# Patient Record
Sex: Female | Born: 1960 | Race: Black or African American | Hispanic: No | Marital: Married | Smoking: Former smoker
Health system: Southern US, Community
[De-identification: ages and names within clinical notes are randomized; demographics above are authoritative.]

## PROBLEM LIST (undated history)

## (undated) DIAGNOSIS — M3210 Systemic lupus erythematosus, organ or system involvement unspecified: Secondary | ICD-10-CM

## (undated) DIAGNOSIS — F900 Attention-deficit hyperactivity disorder, predominantly inattentive type: Secondary | ICD-10-CM

## (undated) DIAGNOSIS — E663 Overweight: Secondary | ICD-10-CM

## (undated) DIAGNOSIS — I10 Essential (primary) hypertension: Secondary | ICD-10-CM

## (undated) DIAGNOSIS — G43119 Migraine with aura, intractable, without status migrainosus: Secondary | ICD-10-CM

## (undated) DIAGNOSIS — E048 Other specified nontoxic goiter: Secondary | ICD-10-CM

## (undated) HISTORY — DX: Other specified nontoxic goiter: E04.8

## (undated) HISTORY — DX: Overweight: E66.3

## (undated) HISTORY — DX: Attention-deficit hyperactivity disorder, predominantly inattentive type: F90.0

---
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
## 2004-11-09 DIAGNOSIS — M3210 Systemic lupus erythematosus, organ or system involvement unspecified: Secondary | ICD-10-CM | POA: Insufficient documentation

## 2004-11-09 HISTORY — DX: Systemic lupus erythematosus, organ or system involvement unspecified: M32.10

## 2008-08-24 ENCOUNTER — Ambulatory Visit: Payer: Self-pay | Admitting: Vascular Surgery

## 2011-03-24 NOTE — Assessment & Plan Note (Signed)
OFFICE VISIT   Kathy Villa, Kathy Villa  DOB:  31-Aug-1961                                       08/24/2008  EAVWU#:[id number]   The patient presents today for evaluation of left leg varicosities.  She  has a long history of increasing varicosities, most particularly in her  left pretibial area.  She reports that these have been present for 20  years and have been progressive.  She is beginning to have more pain  associated with these as well.  She does not have any history of deep  venous thrombosis.  She does not have any history of bleeding.  She does  have a history of lupus.  She does have a history of superficial  thrombophlebitis in the past.  Her past history is also significant for  migraines.  She does not have any medication allergies.  She does have  allergy to ragweed and pollen.   PHYSICAL EXAM:  A well-developed, well-nourished black female appearing  stated age of 76.  Her blood pressure 144/86, pulse 82, respirations 18.  Her dorsalis pedis pulses are 2+ bilaterally.  She does have scattered  telangectasia and scattered small varicosities on her right leg.  On her  left leg she has marked varicosities over the pretibial area extending  most prominently down onto her ankle and lateral foot.  She does have  varicosities across her left anterior thigh extending to her lateral  knee.  She underwent a formal duplex in our office and this revealed no  evidence of DVT.  She does have no reflux in her great saphenous vein.  She does have multiple tributaries off of her saphenofemoral junction on  the left, extending onto her thigh.  On tracing these, these appear to  fill the area over her pretibial area further distally.  I discussed  this at length with the patient.  I do not feel that she has any  indication for ablation of her saphenous vein, since we are not able to  demonstrate any reflux.  I did explain the possibility of stab  phlebectomy for  relief of symptoms over this area.  I reassured her this  is not limb threatening and does not preclude her to more serious  complications.  She will continue to consider her options and will  notify us should she wish to proceed with more definitive therapy.  We  will see her again at her convenience.  She understands that she will  require compression garments prior to any sort of phlebectomy treatment  to determine if this will aid her symptoms.   Kathy Villa, M.D.  Electronically Signed   TFE/MEDQ  D:  08/24/2008  T:  08/27/2008  Job:  [id number]   cc:   Kathy Villa

## 2011-03-24 NOTE — Procedures (Signed)
LOWER EXTREMITY VENOUS REFLUX EXAM   INDICATION:  Bilateral varicose veins for approximately 20 years.   EXAM:  Using color-flow imaging and pulse Doppler spectral analysis, the  right and left common femoral, superficial femoral, popliteal, posterior  tibial, greater and lesser saphenous veins are evaluated.  There is no  evidence suggesting deep venous insufficiency in the right or left lower  extremity.   The right and left saphenofemoral junction is competent.   The right and left GSV is competent with the caliber as described below.   The right and left proximal short saphenous veins demonstrate  competency.   GSV Diameter (used if found to be incompetent only)                                            Right    Left  Proximal Greater Saphenous Vein           cm       cm  Proximal-to-mid-thigh                     cm       cm  Mid thigh                                 cm       cm  Mid-distal thigh                          cm       cm  Distal thigh                              cm       cm  Knee                                      cm       cm   IMPRESSION:  1. Right and left greater saphenous veins are identified.  2. The right and left greater saphenous vein are not aneurysmal.  3. The right and left greater saphenous vein are not tortuous.  4. The deep venous system is competent bilaterally.  5. The right and left lesser saphenous vein is competent.  6. There is reflux at the left saphenofemoral junction which does not      continue into the greater saphenous vein.  Instead, it flows into      multiple tributary veins at the proximal thigh.       ___________________________________________  Larina Earthly, M.D.   MC/MEDQ  D:  08/24/2008  T:  08/24/2008  Job:  [id number]

## 2011-06-10 LAB — HM PAP SMEAR

## 2014-11-09 DIAGNOSIS — I10 Essential (primary) hypertension: Secondary | ICD-10-CM | POA: Insufficient documentation

## 2014-11-09 HISTORY — DX: Essential (primary) hypertension: I10

## 2018-07-29 LAB — HM MAMMOGRAPHY: HM Mammogram: NORMAL (ref 0–4)

## 2019-12-20 ENCOUNTER — Encounter: Payer: Self-pay | Admitting: Neurology

## 2019-12-29 ENCOUNTER — Telehealth (INDEPENDENT_AMBULATORY_CARE_PROVIDER_SITE_OTHER): Payer: BC Managed Care – PPO | Admitting: Family Medicine

## 2019-12-29 VITALS — Ht 66.0 in

## 2019-12-29 DIAGNOSIS — R519 Headache, unspecified: Secondary | ICD-10-CM | POA: Diagnosis not present

## 2019-12-29 DIAGNOSIS — G4700 Insomnia, unspecified: Secondary | ICD-10-CM | POA: Diagnosis not present

## 2019-12-29 DIAGNOSIS — M329 Systemic lupus erythematosus, unspecified: Secondary | ICD-10-CM

## 2019-12-29 DIAGNOSIS — R42 Dizziness and giddiness: Secondary | ICD-10-CM

## 2019-12-29 DIAGNOSIS — R202 Paresthesia of skin: Secondary | ICD-10-CM

## 2020-01-05 ENCOUNTER — Other Ambulatory Visit: Payer: Self-pay | Admitting: Family Medicine

## 2020-01-05 ENCOUNTER — Other Ambulatory Visit: Payer: BC Managed Care – PPO

## 2020-01-05 ENCOUNTER — Other Ambulatory Visit: Payer: Self-pay

## 2020-01-05 DIAGNOSIS — M328 Other forms of systemic lupus erythematosus: Secondary | ICD-10-CM

## 2020-01-09 LAB — ANA,IFA RA DIAG PNL W/RFLX TIT/PATN
ANA Titer 1: POSITIVE — AB
Cyclic Citrullin Peptide Ab: 4 units (ref 0–19)
Rheumatoid fact SerPl-aCnc: <10 IU/mL (ref 0.0–13.9)

## 2020-01-09 LAB — FANA STAINING PATTERNS: Homogeneous Pattern: 1:80 {titer}

## 2020-01-14 ENCOUNTER — Encounter: Payer: Self-pay | Admitting: Family Medicine

## 2020-01-14 DIAGNOSIS — G4700 Insomnia, unspecified: Secondary | ICD-10-CM | POA: Insufficient documentation

## 2020-01-14 DIAGNOSIS — M329 Systemic lupus erythematosus, unspecified: Secondary | ICD-10-CM

## 2020-01-14 DIAGNOSIS — R519 Headache, unspecified: Secondary | ICD-10-CM

## 2020-01-14 DIAGNOSIS — R42 Dizziness and giddiness: Secondary | ICD-10-CM | POA: Insufficient documentation

## 2020-01-14 DIAGNOSIS — R202 Paresthesia of skin: Secondary | ICD-10-CM | POA: Insufficient documentation

## 2020-01-14 HISTORY — DX: Headache, unspecified: R51.9

## 2020-01-14 HISTORY — DX: Paresthesia of skin: R20.2

## 2020-01-14 HISTORY — DX: Dizziness and giddiness: R42

## 2020-01-14 HISTORY — DX: Insomnia, unspecified: G47.00

## 2020-01-14 HISTORY — DX: Systemic lupus erythematosus, unspecified: M32.9

## 2020-01-14 NOTE — Assessment & Plan Note (Signed)
Intermittent. Left sided. Unclear etiology.

## 2020-01-14 NOTE — Assessment & Plan Note (Signed)
Again unclear etiology. Recommended caution when these episodes come on.

## 2020-01-14 NOTE — Assessment & Plan Note (Signed)
Encouraged a routine. Decrease stress.

## 2020-01-14 NOTE — Assessment & Plan Note (Signed)
Ordered lupus panel. Patient to come next week for labs. Patient has an appointment with [facility] Rheumatology coming up.

## 2020-01-14 NOTE — Assessment & Plan Note (Signed)
Too brief to treat. Defer to neurology.

## 2020-01-14 NOTE — Progress Notes (Signed)
Virtual Visit via Telephone Note   This visit type was conducted due to national recommendations for restrictions regarding the COVID-19 Pandemic (e.g. social distancing) in an effort to limit this patient's exposure and mitigate transmission in our community.  Due to her co-morbid illnesses, this patient is at least at moderate risk for complications without adequate follow up.  This format is felt to be most appropriate for this patient at this time.  The patient did not have access to video technology/had technical difficulties with video requiring transitioning to audio format only (telephone).  All issues noted in this document were discussed and addressed.  No physical exam could be performed with this format.  Patient verbally consented to a telehealth visit.   Date:  01/14/2020   ID:  Kathy Villa, DOB 1961-04-20, MRN [id number]  Patient Location: Home Provider Location: Office  PCP:  Rochel Brome, MD   Evaluation Performed:  Follow-Up Visit  Chief Complaint: neurological symptoms.  History of Present Illness:    Kathy Villa is a 59 y.o. female with some unusual symptoms.  These are intermittent.  They include paresthesias of the left side of her body.  Some weakness on the left side of her body sometimes her left eye will twitch.  None of these necessarily occur simultaneously however they do seem to focus on the left side.  She also gets dizzy with a lot of activity.  She has issues with sleep which seem to be cyclical and occur about once a month.  She sleeps well for 3 days and then she will have no sleep for 4 days.  She suffers from severe fatigue as a result.  She is familiar with bipolar and denies any manic symptoms.  Denies depression.  She also reports that she sometimes has soreness on the left side of her neck and questions whether it may be her thyroid.  She also occasionally has left earaches and left sided headaches.  The headaches are a sharp pain that is brief.  Previous work up in January 2021 included thyroid panel was normal. ESR was normal. Dr. Henrene Pastor saw her in the office at the time of her neurological "flare up." Labs from 3 months included cbc, cmp, tsh, flp which were all good. MRI of brain was ordered and showed one lesion perhaps concerning for MS. It was fairly nonspecific. She is scheduled to see Dr. Tomi Likens with neurology in April 2021.   The patient does have symptoms concerning for COVID-19 infection (fever, chills, cough, or new shortness of breath).     Past Medical History:  Diagnosis Date  . Attention deficit hyperactivity disorder, predominantly inattentive type   . Essential (primary) hypertension   . Migraine with aura, intractable, without status migrainosus   . Other specified nontoxic goiter   . Overweight   . Systemic lupus erythematosus, organ or system involvement unspecified [facility])    Past Surgical History:  Procedure Laterality Date  . CESAREAN SECTION      Current Meds  Medication Sig  . amLODipine (NORVASC) 5 MG tablet Take 5 mg by mouth daily.  Marland Kitchen dicyclomine (BENTYL) 10 MG capsule Take 10 mg by mouth 2 (two) times daily as needed (diarrhea).  . hydroxychloroquine (PLAQUENIL) 200 MG tablet Take 200 mg by mouth 2 (two) times daily.  Marland Kitchen omeprazole (PRILOSEC) 20 MG capsule Take 20 mg by mouth daily.     Allergies:   Patient has no known allergies.   Social History   Tobacco Use  .  Smoking status: Former Smoker    Types: Cigarettes    Quit date: 11/09/1982    Years since quitting: 37.2  . Smokeless tobacco: Never Used  Substance Use Topics  . Alcohol use: Not Currently  . Drug use: Never     Family Hx: The patient's family history includes Congestive Heart Failure in her father; Stroke in her mother.  ROS:   Review of Systems  Constitutional: Negative for chills, diaphoresis, fever.  HENT: Negative for congestion and sore throat.   Respiratory: Negative for cough and shortness of breath Cardiovascular:  Negative for chest pain and palpitations.  Gastrointestinal: Negative for abdominal pain, constipation, diarrhea, nausea and vomiting.  Neurological: see HPI. Psychiatric/Behavioral: Negative for depression. Negative for anhedonia. Negative for anxiety.     Labs/Other Tests and Data Reviewed:    Recent Labs: No results found for requested labs within last 8760 hours.   Recent Lipid Panel No results found for: CHOL, TRIG, HDL, CHOLHDL, LDLCALC, LDLDIRECT  Wt Readings from Last 3 Encounters:  No data found for Wt     Objective:    Vital Signs:  Ht 5' 6"  (1.676 m)    VITAL SIGNS:  reviewed GEN:  no acute distress  ASSESSMENT & PLAN:   SLE (systemic lupus erythematosus related syndrome) (HCC) Ordered lupus panel. Patient to come next week for labs. Patient has an appointment with [facility] coming up.   Paresthesias Intermittent. Left sided. Unclear etiology.   Dizziness and giddiness Again unclear etiology. Recommended caution when these episodes come on.   Left-sided headache Too brief to treat. Defer to neurology.  Insomnia Encouraged a routine. Decrease stress.   Currently is sleeping ok. No medicine needed at this time.  Time:   Today, I have spent 15 minutes with the patient with telehealth technology discussing the above problems.     Medication Adjustments/Labs and Tests Ordered: Current medicines are reviewed at length with the patient today.  Concerns regarding medicines are outlined above.   Tests Ordered: No orders of the defined types were placed in this encounter.   Medication Changes: No orders of the defined types were placed in this encounter.   Follow Up:  In Person in 3 month(s)  Signed, Rochel Brome, MD  01/14/2020 11:33 PM    [place]

## 2020-02-11 NOTE — Progress Notes (Addendum)
NEUROLOGY CONSULTATION NOTE  Kathy BIERNAT MRN: [id number] DOB: June 23, 1961  Referring provider: Reinaldo Meeker, MD Primary care provider: Rochel Brome, MD  Reason for consult:  Multiple neurologic symptoms; concern for multiple sclerosis  HISTORY OF PRESENT ILLNESS: Kathy Villa is a 59 year old right-handed black female with SLE who presents for multiple neurologic symptoms and concern for multiple sclerosis.  History supplemented by primary care notes.  For 2 years she has had left sided symptoms.  In the past 4 to 5 months, it is more noticeable.  She has constant left sided weakness and numbness of the arm and leg.  There is intermittent numbness and paresthesias.  Sometimes she notes burning sensation on the left side of her nose.  She may have brief shocks in the arm and leg but overall no associated pain.  She reports that sometimes her left eyelid may close or twitch.  Sometimes she feels that her left eye may abduct unintentionally.  No neck or back pain.  Over the past 4 or 5 months, she also reports dull non-throbbing left-sided headache with nausea, photophobia and phonophobia, lasting an hour and occurring once or twice a week.  She has migraines but she feels that this is different.  3 months ago, she leaned her head back on the ottoman and developed sudden spinning sensation.  She continues to have it from time to time, triggered by change in position, such as being spun around in the chair when at the salon or if she is watching movement.  It is brief.  She has SLE with arthralgia.  Labs in January reportedly demonstrated normal sed rate and thyroid panel.  ANA from February was positive and has been referred to rheumatology.  She had an MRI of the brain reportedly showed a solitary nonspecific white matter focus. Imaging and report not available.  Family history unknown as she is adopted.   PAST MEDICAL HISTORY: Past Medical History:  Diagnosis Date  . Attention  deficit hyperactivity disorder, predominantly inattentive type   . Essential (primary) hypertension   . Migraine with aura, intractable, without status migrainosus   . Other specified nontoxic goiter   . Overweight   . Systemic lupus erythematosus, organ or system involvement unspecified ([place])     PAST SURGICAL HISTORY: Past Surgical History:  Procedure Laterality Date  . CESAREAN SECTION      MEDICATIONS: Current Outpatient Medications on File Prior to Visit  Medication Sig Dispense Refill  . amLODipine (NORVASC) 5 MG tablet Take 5 mg by mouth daily.    Marland Kitchen dicyclomine (BENTYL) 10 MG capsule Take 10 mg by mouth 2 (two) times daily as needed (diarrhea).    . hydroxychloroquine (PLAQUENIL) 200 MG tablet Take 200 mg by mouth 2 (two) times daily.    Marland Kitchen omeprazole (PRILOSEC) 20 MG capsule Take 20 mg by mouth daily.     No current facility-administered medications on file prior to visit.    ALLERGIES: No Known Allergies  FAMILY HISTORY: Family History  Problem Relation Age of Onset  . Stroke Mother   . Congestive Heart Failure Father     SOCIAL HISTORY: Social History   Socioeconomic History  . Marital status: Married    Spouse name: Not on file  . Number of children: 3  . Years of education: Not on file  . Highest education level: Not on file  Occupational History  . Occupation: Actuary of [employer]  Tobacco Use  . Smoking status:  Former Smoker    Types: Cigarettes    Quit date: 11/09/1982    Years since quitting: 37.2  . Smokeless tobacco: Never Used  Substance and Sexual Activity  . Alcohol use: Not Currently  . Drug use: Never  . Sexual activity: Not on file  Other Topics Concern  . Not on file  Social History Narrative  . Not on file   Social Determinants of Health   Financial Resource Strain:   . Difficulty of Paying Living Expenses:   Food Insecurity:   . Worried About Programme researcher, broadcasting/film/video in the Last Year:   . Engineer, site in the Last Year:   Transportation Needs:   . Freight forwarder (Medical):   Marland Kitchen Lack of Transportation (Non-Medical):   Physical Activity:   . Days of Exercise per Week:   . Minutes of Exercise per Session:   Stress:   . Feeling of Stress :   Social Connections:   . Frequency of Communication with Friends and Family:   . Frequency of Social Gatherings with Friends and Family:   . Attends Religious Services:   . Active Member of Clubs or Organizations:   . Attends Banker Meetings:   Marland Kitchen Marital Status:   Intimate Partner Violence:   . Fear of Current or Ex-Partner:   . Emotionally Abused:   Marland Kitchen Physically Abused:   . Sexually Abused:     PHYSICAL EXAM: Blood pressure 133/82, pulse 82, height 5\' 6"  (1.676 m), weight 196 lb (88.9 kg), SpO2 98 %. General: No acute distress.  Patient appears well-groomed.   Head:  Normocephalic/atraumatic Eyes:  fundi examined but not visualized Neck: supple, no paraspinal tenderness, full range of motion Back: No paraspinal tenderness Heart: regular rate and rhythm Lungs: Clear to auscultation bilaterally. Vascular: No carotid bruits. Neurological Exam: Mental status: alert and oriented to person, place, and time, recent and remote memory intact, fund of knowledge intact, attention and concentration intact, speech fluent and not dysarthric, language intact. Cranial nerves: CN I: not tested CN II: pupils equal, round and reactive to light, visual fields intact CN III, IV, VI:  full range of motion, no nystagmus, no ptosis CN V: facial sensation intact CN VII: upper and lower face symmetric CN VIII: hearing intact CN IX, X: gag intact, uvula midline CN XI: sternocleidomastoid and trapezius muscles intact CN XII: tongue midline Bulk & Tone: normal, no fasciculations. Motor:  5/5 throughout  Sensation:  Pinprick sensation reduced; and vibration sensation slightly reduced in left foot.. Deep Tendon Reflexes:  2+ throughout,  toes downgoing.  Finger to nose testing:  Without dysmetria.  Heel to shin:  Without dysmetria.  Gait:  Normal station and stride.  Able to turn and tandem walk. Romberg negative.  IMPRESSION: 1.  Abnormal white matter finding on brain MRI.  Imaging and report not available but sounds like it may be nonspecific and may be seen in people with HTN and migraines.   2.  Left sided weakness.  She exhibits some mild give way weakness in left knee extension but otherwise no convincing objective weakness on exam. 3.  Intermittent left sided paresthesias.   4.  Dizziness, consistent with benign paroxysmal positional vertigo 5.  Left sided headache, likely migraine without aura, without status migrainosus, not intractable.   PLAN: 1.  Given MRI findings and her reported symptoms, will order MRI of cervical spine with and without contrast to evaluate for any cord lesion.   2.  Will obtain MRI report and I have asked Ms. Sroka to obtain a copy of her MRI on CD and have it sent to me for review. 3.  If MRIs unremarkable, consider NCV-EMG of left arm and leg or consider lumbar puncture for CSF analysis. 4.  If dizziness does not improve, consider vestibular rehab 5. Limit use of pain relievers to no more than 2 days out of week to prevent risk of rebound or medication-overuse headache.  6. Follow up after testing.  Thank you for allowing me to take part in the care of this patient.  Shon Millet, DO  CC:  Blane Ohara, MD  Brent Bulla, MD  ADDENDUM:  Received MRI BRAIN WO report from 12/05/2019:  4 mm focus of T2 hyperintensity within the left frontal periventricular white matter, nonspecific in isolation.  Finding does not correlate with her symptoms.  I would still like to evaluate the images for myself. Shon Millet, DO  ADDENDUM (03/05/2020):  Reviewed imaging of brain MRI.  There is a single punctate T2 hyperintensity.  It looks unremarkable.  I do not suspect multiple sclerosis.  I have no  explanation for her symptoms.  Most of her symptoms are subjective and the one objective finding on neurological exam was what seemed like give-way weakness.   Shon Millet, DO

## 2020-02-12 ENCOUNTER — Encounter: Payer: Self-pay | Admitting: Neurology

## 2020-02-12 ENCOUNTER — Ambulatory Visit: Payer: BC Managed Care – PPO | Admitting: Neurology

## 2020-02-12 ENCOUNTER — Other Ambulatory Visit: Payer: Self-pay

## 2020-02-12 VITALS — BP 133/82 | HR 82 | Ht 66.0 in | Wt 196.0 lb

## 2020-02-12 DIAGNOSIS — R519 Headache, unspecified: Secondary | ICD-10-CM | POA: Diagnosis not present

## 2020-02-12 DIAGNOSIS — R9082 White matter disease, unspecified: Secondary | ICD-10-CM | POA: Diagnosis not present

## 2020-02-12 DIAGNOSIS — R42 Dizziness and giddiness: Secondary | ICD-10-CM

## 2020-02-12 DIAGNOSIS — R200 Anesthesia of skin: Secondary | ICD-10-CM

## 2020-02-12 DIAGNOSIS — R531 Weakness: Secondary | ICD-10-CM | POA: Diagnosis not present

## 2020-02-12 DIAGNOSIS — M329 Systemic lupus erythematosus, unspecified: Secondary | ICD-10-CM

## 2020-02-12 NOTE — Patient Instructions (Addendum)
1.  Get a disc of your brain MRI from [place] and send it to me for review 2.  We will order MRI of cervical spine with and without contrast 3.  Further recommendations pending results.

## 2020-02-23 ENCOUNTER — Other Ambulatory Visit: Payer: Self-pay | Admitting: Family Medicine

## 2020-02-26 ENCOUNTER — Other Ambulatory Visit: Payer: Self-pay

## 2020-02-26 MED ORDER — AMLODIPINE BESYLATE 5 MG PO TABS: 5.0000 mg | ORAL_TABLET | Freq: Every day | ORAL | 0 refills | Status: DC

## 2020-03-07 ENCOUNTER — Telehealth: Payer: Self-pay

## 2020-03-07 NOTE — Telephone Encounter (Signed)
Pt informed of MRI Results 

## 2020-03-07 NOTE — Telephone Encounter (Signed)
-----   Message from Drema Dallas, DO sent at 03/05/2020  4:22 PM EDT ----- Please let patient know that I reviewed her brain MRI.  There seems to be a very tiny spot, but I don't think it is of any clinical significance.  Overall, it is an unremarkable study with nothing concerning.  I do not think she has MS.  I really don't have an explanation for your symptoms.

## 2020-03-16 ENCOUNTER — Other Ambulatory Visit: Payer: BC Managed Care – PPO

## 2020-03-18 ENCOUNTER — Telehealth: Payer: Self-pay

## 2020-03-18 NOTE — Telephone Encounter (Signed)
Kathy Villa called to report that she was seen by [facility] Rhe[facility]ology and treated for a UTI for 5 days.  They symptoms have not cleared and she feels like she needs more medication.  She does not know the name of the medication that she was given.  She request that a prescription be sent to [facility].

## 2020-03-19 ENCOUNTER — Other Ambulatory Visit: Payer: Self-pay

## 2020-03-19 ENCOUNTER — Ambulatory Visit (INDEPENDENT_AMBULATORY_CARE_PROVIDER_SITE_OTHER): Payer: BC Managed Care – PPO

## 2020-03-19 DIAGNOSIS — N3000 Acute cystitis without hematuria: Secondary | ICD-10-CM | POA: Diagnosis not present

## 2020-03-19 LAB — POCT URINALYSIS DIP (CLINITEK)
Bilirubin, UA: NEGATIVE
Blood, UA: NEGATIVE
Glucose, UA: NEGATIVE mg/dL
Ketones, POC UA: NEGATIVE mg/dL
Nitrite, UA: NEGATIVE
POC PROTEIN,UA: NEGATIVE
Spec Grav, UA: 1.015 (ref 1.010–1.025)
Urobilinogen, UA: 0.2 E.U./dL
pH, UA: 6 (ref 5.0–8.0)

## 2020-03-19 NOTE — Progress Notes (Signed)
      Patient came in today for a UA recheck at the request of her Rheumatologist.   Results for orders placed or performed in visit on 03/19/20 (from the past 24 hour(s))  POCT URINALYSIS DIP (CLINITEK)     Status: Abnormal   Collection Time: 03/19/20 11:45 AM  Result Value Ref Range   Color, UA light yellow (A) yellow   Clarity, UA clear clear   Glucose, UA negative negative mg/dL   Bilirubin, UA negative negative   Ketones, POC UA negative negative mg/dL   Spec Grav, UA 3.005 1.102 - 1.025   Blood, UA negative negative   pH, UA 6.0 5.0 - 8.0   POC PROTEIN,UA negative negative, trace   Urobilinogen, UA 0.2 0.2 or 1.0 E.U./dL   Nitrite, UA Negative Negative   Leukocytes, UA Trace (A) Negative     Creola Corn, LPN 09/25/34 67:01 AM

## 2020-03-19 NOTE — Telephone Encounter (Signed)
Selena Batten, Did you send her urine for culture? It was only mildly abnormal. Also what was told to the patient. If something needs to be done on Wednesday ask Kennon Rounds or Dr. Marina Goodell please. Thanks, Dr. Sedalia Muta

## 2020-03-20 NOTE — Telephone Encounter (Signed)
No, I did not culture her urine.  I called her this morning to see if she could tell me the name of the ABT-she states that she does not know and will call her Rheumatologist to find out.  I did not see it listed on consultation report from that office.  I asked her if she continues to have symptoms and she states that she thinks so then said yes.  Awaiting her call back with name of ABT.

## 2020-03-26 ENCOUNTER — Ambulatory Visit: Payer: BC Managed Care – PPO | Admitting: Physician Assistant

## 2020-04-01 NOTE — Progress Notes (Signed)
Duplicate visit. kc

## 2020-04-03 ENCOUNTER — Other Ambulatory Visit: Payer: Self-pay

## 2020-04-03 ENCOUNTER — Ambulatory Visit: Payer: BC Managed Care – PPO | Admitting: Family Medicine

## 2020-04-03 VITALS — BP 110/64 | HR 78 | Temp 97.7°F | Resp 18 | Ht 66.0 in | Wt 196.0 lb

## 2020-04-03 DIAGNOSIS — N3000 Acute cystitis without hematuria: Secondary | ICD-10-CM

## 2020-04-03 DIAGNOSIS — M329 Systemic lupus erythematosus, unspecified: Secondary | ICD-10-CM | POA: Diagnosis not present

## 2020-04-03 LAB — POCT URINALYSIS DIPSTICK
Bilirubin, UA: NEGATIVE
Blood, UA: NEGATIVE
Glucose, UA: NEGATIVE
Ketones, UA: NEGATIVE
Nitrite, UA: NEGATIVE
Protein, UA: POSITIVE — AB
Spec Grav, UA: >=1.03 — AB (ref 1.010–1.025)
Urobilinogen, UA: 0.2 E.U./dL
pH, UA: 6 (ref 5.0–8.0)

## 2020-04-03 MED ORDER — CIPROFLOXACIN HCL 500 MG PO TABS: 500.0000 mg | ORAL_TABLET | Freq: Two times a day (BID) | ORAL | 0 refills | Status: DC

## 2020-04-03 NOTE — Patient Instructions (Signed)
Cipro

## 2020-04-03 NOTE — Progress Notes (Signed)
Acute Office Visit  Subjective:    Patient ID: Kathy Villa, female    DOB: 1961/07/01, 59 y.o.   MRN: [id number]  Chief Complaint  Patient presents with  . Urinary Tract Infection    HPI Patient is in today for follow up of UTI. She has not been feeling well. She was prescribed macrobid by her rheumatologist. She has had some nausea, abdominal pain/pressure, but no dysuria, hematuria, or urgency.   Past Medical History:  Diagnosis Date  . Attention deficit hyperactivity disorder, predominantly inattentive type   . Essential (primary) hypertension   . Migraine with aura, intractable, without status migrainosus   . Other specified nontoxic goiter   . Overweight   . Systemic lupus erythematosus, organ or system involvement unspecified [facility])     Past Surgical History:  Procedure Laterality Date  . CESAREAN SECTION      Family History  Problem Relation Age of Onset  . Stroke Mother   . Congestive Heart Failure Father     Social History   Socioeconomic History  . Marital status: Married    Spouse name: Not on file  . Number of children: 3  . Years of education: Not on file  . Highest education level: Not on file  Occupational History  . Occupation: Actuary of [employer]  Tobacco Use  . Smoking status: Former Smoker    Types: Cigarettes    Quit date: 11/09/1982    Years since quitting: 37.4  . Smokeless tobacco: Never Used  Substance and Sexual Activity  . Alcohol use: Not Currently  . Drug use: Never  . Sexual activity: Not on file  Other Topics Concern  . Not on file  Social History Narrative   Right handed   Lives with husband   Social Determinants of Health   Financial Resource Strain:   . Difficulty of Paying Living Expenses:   Food Insecurity:   . Worried About Charity fundraiser in the Last Year:   . Arboriculturist in the Last Year:   Transportation Needs:   . Film/video editor (Medical):   Marland Kitchen Lack of  Transportation (Non-Medical):   Physical Activity:   . Days of Exercise per Week:   . Minutes of Exercise per Session:   Stress:   . Feeling of Stress :   Social Connections:   . Frequency of Communication with Friends and Family:   . Frequency of Social Gatherings with Friends and Family:   . Attends Religious Services:   . Active Member of Clubs or Organizations:   . Attends Archivist Meetings:   Marland Kitchen Marital Status:   Intimate Partner Violence:   . Fear of Current or Ex-Partner:   . Emotionally Abused:   Marland Kitchen Physically Abused:   . Sexually Abused:     Outpatient Medications Prior to Visit  Medication Sig Dispense Refill  . amLODipine (NORVASC) 5 MG tablet Take 1 tablet (5 mg total) by mouth daily. 90 tablet 0  . dicyclomine (BENTYL) 10 MG capsule Take 10 mg by mouth 2 (two) times daily as needed (diarrhea).    . hydroxychloroquine (PLAQUENIL) 200 MG tablet TAKE 1 TABLET BY MOUTH TWICE DAILY 60 tablet 2  . omeprazole (PRILOSEC) 20 MG capsule Take 20 mg by mouth daily.     No facility-administered medications prior to visit.    No Known Allergies  Review of Systems  Constitutional: Positive for fatigue. Negative for chills and fever.  HENT: Negative for congestion and ear pain.   Respiratory: Negative for cough and shortness of breath.   Cardiovascular: Positive for palpitations. Negative for chest pain.  Gastrointestinal: Positive for diarrhea and nausea. Negative for abdominal pain.  Genitourinary: Positive for frequency and pelvic pain. Negative for dysuria.  Neurological: Positive for light-headedness and headaches.       Objective:    Physical Exam Vitals reviewed.  Constitutional:      Appearance: Normal appearance. She is normal weight.  Cardiovascular:     Rate and Rhythm: Normal rate and regular rhythm.     Heart sounds: Normal heart sounds.  Pulmonary:     Effort: Pulmonary effort is normal. No respiratory distress.     Breath sounds: Normal  breath sounds.  Abdominal:     General: Abdomen is flat. Bowel sounds are normal.     Palpations: Abdomen is soft.     Tenderness: There is no abdominal tenderness.  Neurological:     Mental Status: She is alert and oriented to person, place, and time.  Psychiatric:        Mood and Affect: Mood normal.        Behavior: Behavior normal.     BP 110/64   Pulse 78   Temp 97.7 F (36.5 C)   Resp 18   Ht 5' 6"  (1.676 m)   Wt 196 lb (88.9 kg)   BMI 31.64 kg/m  Wt Readings from Last 3 Encounters:  04/03/20 196 lb (88.9 kg)  02/12/20 196 lb (88.9 kg)    Health Maintenance Due  Topic Date Due  . Hepatitis C Screening  Never done  . COVID-19 Vaccine (1) Never done  . HIV Screening  Never done  . TETANUS/TDAP  Never done  . COLONOSCOPY  Never done  . PAP SMEAR-Modifier  06/09/2014    There are no preventive care reminders to display for this patient.   No results found for: TSH No results found for: WBC, HGB, HCT, MCV, PLT No results found for: NA, K, CHLORIDE, CO2, GLUCOSE, BUN, CREATININE, BILITOT, ALKPHOS, AST, ALT, PROT, ALBUMIN, CALCIUM, ANIONGAP, EGFR, GFR No results found for: CHOL No results found for: HDL No results found for: LDLCALC No results found for: TRIG No results found for: CHOLHDL No results found for: HGBA1C     Assessment & Plan:  1. Acute cystitis without hematuria - Start Cipro 500 mg 1 tablet PO BID for 7 days #14. - POCT urinalysis dipstick - Urine Culture   2. SLE (systemic lupus erythematosus related syndrome) ([place])  management by rheumatology.  Meds ordered this encounter  Medications  . ciprofloxacin (CIPRO) 500 MG tablet    Sig: Take 1 tablet (500 mg total) by mouth 2 (two) times daily.    Dispense:  14 tablet    Refill:  0    Orders Placed This Encounter  Procedures  . Urine Culture  . POCT urinalysis dipstick     Follow-up: Return in about 2 weeks (around 04/17/2020) for nurse visit to recheck UA. Marland Kitchen  An After Visit Summary  was printed and given to the patient.  Rochel Brome [facility] [phone]

## 2020-04-04 LAB — URINE CULTURE

## 2020-04-08 ENCOUNTER — Encounter: Payer: Self-pay | Admitting: Family Medicine

## 2020-04-15 ENCOUNTER — Encounter: Payer: Self-pay | Admitting: Family Medicine

## 2020-04-17 ENCOUNTER — Other Ambulatory Visit: Payer: Self-pay | Admitting: Family Medicine

## 2020-04-17 ENCOUNTER — Ambulatory Visit (INDEPENDENT_AMBULATORY_CARE_PROVIDER_SITE_OTHER): Payer: BC Managed Care – PPO

## 2020-04-17 DIAGNOSIS — N3000 Acute cystitis without hematuria: Secondary | ICD-10-CM | POA: Diagnosis not present

## 2020-04-17 LAB — POCT URINALYSIS DIPSTICK
Bilirubin, UA: NEGATIVE
Blood, UA: NEGATIVE
Glucose, UA: NEGATIVE
Ketones, UA: NEGATIVE
Nitrite, UA: NEGATIVE
Protein, UA: POSITIVE — AB
Spec Grav, UA: 1.025 (ref 1.010–1.025)
Urobilinogen, UA: 0.2 E.U./dL
pH, UA: 6 (ref 5.0–8.0)

## 2020-04-17 MED ORDER — SULFAMETHOXAZOLE-TRIMETHOPRIM 800-160 MG PO TABS: 1.0000 | ORAL_TABLET | Freq: Two times a day (BID) | ORAL | 0 refills | Status: DC

## 2020-04-17 NOTE — Progress Notes (Signed)
Recheck UA positve.  Sent culture.  Sent Bactrim Ds.  Recommend refer to urologist for evaluation if pt agrees.  kc

## 2020-04-17 NOTE — Progress Notes (Signed)
Patient came in today for a recheck of urine.

## 2020-04-17 NOTE — Progress Notes (Signed)
Any symptoms?

## 2020-04-17 NOTE — Progress Notes (Signed)
Recheck UA positve.  Sent culture.  Sent Bactrim Ds.  Recommend refer to urologist for evaluation if pt agrees.  kc

## 2020-04-20 ENCOUNTER — Other Ambulatory Visit: Payer: BC Managed Care – PPO

## 2020-04-25 ENCOUNTER — Telehealth: Payer: Self-pay

## 2020-04-25 NOTE — Telephone Encounter (Signed)
LMOVM. Pt to give [place] a call back. Per Dr. Everlena Cooper he wants pt to get the MRI of the Cervical Spine he ordered back in April 2021

## 2020-04-25 NOTE — Telephone Encounter (Signed)
-----   Message from Drema Dallas, DO sent at 04/25/2020  7:44 AM EDT ----- Received note from her rheumatologist, Dr. Dierdre Forth.  He mentions that she has not yet scheduled her MRI of cervical spine with and without contrast.  Although the MRI of brain was unremarkable, I would still like her to get MRI of cervical spine for any other explanation of left sided weakness.  I would like it performed prior to her follow up with me in August.

## 2020-05-30 ENCOUNTER — Other Ambulatory Visit: Payer: Self-pay | Admitting: Family Medicine

## 2020-06-28 ENCOUNTER — Ambulatory Visit: Payer: BC Managed Care – PPO | Admitting: Neurology

## 2020-08-08 ENCOUNTER — Other Ambulatory Visit: Payer: Self-pay | Admitting: Family Medicine

## 2020-08-08 NOTE — Telephone Encounter (Signed)
Please call patient and schedule an appointment for chronic follow up. Kc

## 2020-08-27 ENCOUNTER — Other Ambulatory Visit: Payer: Self-pay | Admitting: Family Medicine

## 2020-08-28 ENCOUNTER — Encounter: Payer: Self-pay | Admitting: Family Medicine

## 2020-08-28 ENCOUNTER — Other Ambulatory Visit: Payer: Self-pay

## 2020-08-28 ENCOUNTER — Ambulatory Visit: Payer: BC Managed Care – PPO | Admitting: Family Medicine

## 2020-08-28 VITALS — BP 124/76 | HR 72 | Temp 97.2°F | Resp 16 | Ht 66.0 in | Wt 197.2 lb

## 2020-08-28 DIAGNOSIS — Z23 Encounter for immunization: Secondary | ICD-10-CM

## 2020-08-28 DIAGNOSIS — M329 Systemic lupus erythematosus, unspecified: Secondary | ICD-10-CM

## 2020-08-28 DIAGNOSIS — I10 Essential (primary) hypertension: Secondary | ICD-10-CM | POA: Insufficient documentation

## 2020-08-28 DIAGNOSIS — R531 Weakness: Secondary | ICD-10-CM

## 2020-08-28 DIAGNOSIS — E782 Mixed hyperlipidemia: Secondary | ICD-10-CM

## 2020-08-28 HISTORY — DX: Encounter for immunization: Z23

## 2020-08-28 HISTORY — DX: Essential (primary) hypertension: I10

## 2020-08-28 HISTORY — DX: Mixed hyperlipidemia: E78.2

## 2020-08-28 NOTE — Progress Notes (Signed)
Subjective:  Patient ID: Kathy Villa, female    DOB: June 14, 1961  Age: 59 y.o. MRN: [id number]  Chief Complaint  Patient presents with  . Hypertension   HPI: Hypertension:  Pt presents for follow up of hypertension.  Date of diagnosis 11/2012.  Current nonpharmacologic treatment includes weight loss.  Her current cardiac medication regimen includes a calcium channel blocker ( Norvasc 5 mg QD ).  She is tolerating the medication well without side effects.  Compliance with treatment has been good; she takes her medication as directed, maintains her diet and exercise regimen, and follows up as directed.  Lupus: With regard to the systemic lupus erythematosus, organ or system involvement unspecified, affected organ systems include the skin and the joints.  This was diagnosed approximately more than 15 years ago.  Compliance with treatment has been good; Kathy Villa takes her medication as directed, maintains her diet and exercise regimen, and follows up as directed.  Current medications include Plaquenil.  Primary complaints include joint stiffness. She denies associated swelling, joint redness or joint warmth.    Left sided weakness: intermittently comes and goes. Mri brain showed a 4 mm nonspecific lesion. Pt has seen neurology (Dr. Everlena Cooper) and they have recommend an mri of her cspine and possibly lumbar puncture, which she has deferred due to the cost. She has also had visual changes in the past.   Current Outpatient Medications on File Prior to Visit  Medication Sig Dispense Refill  . amLODipine (NORVASC) 5 MG tablet TAKE 1 TABLET(5 MG) BY MOUTH DAILY 90 tablet 0  . hydroxychloroquine (PLAQUENIL) 200 MG tablet TAKE 1 TABLET BY MOUTH TWICE DAILY 60 tablet 0   No current facility-administered medications on file prior to visit.   Past Medical History:  Diagnosis Date  . Attention deficit hyperactivity disorder, predominantly inattentive type    prefers no medicines.  . Essential (primary)  hypertension 2016  . Migraine with aura, intractable, without status migrainosus 1980  . Other specified nontoxic goiter   . Overweight   . Systemic lupus erythematosus, organ or system involvement unspecified (HCC) 2006   Past Surgical History:  Procedure Laterality Date  . CESAREAN SECTION      Family History  Problem Relation Age of Onset  . Congestive Heart Failure Father    Social History   Socioeconomic History  . Marital status: Married    Spouse name: Not on file  . Number of children: 3  . Years of education: Not on file  . Highest education level: Not on file  Occupational History  . Occupation: Energy manager of [employer]  Tobacco Use  . Smoking status: Former Smoker    Types: Cigarettes    Quit date: 11/09/1982    Years since quitting: 37.8  . Smokeless tobacco: Never Used  Vaping Use  . Vaping Use: Never used  Substance and Sexual Activity  . Alcohol use: Not Currently  . Drug use: Never  . Sexual activity: Yes  Other Topics Concern  . Not on file  Social History Narrative   Right handed   Lives with husband   Social Determinants of Health   Financial Resource Strain:   . Difficulty of Paying Living Expenses: Not on file  Food Insecurity:   . Worried About Programme researcher, broadcasting/film/video in the Last Year: Not on file  . Ran Out of Food in the Last Year: Not on file  Transportation Needs:   . Lack of Transportation (Medical): Not on file  .  Lack of Transportation (Non-Medical): Not on file  Physical Activity:   . Days of Exercise per Week: Not on file  . Minutes of Exercise per Session: Not on file  Stress:   . Feeling of Stress : Not on file  Social Connections:   . Frequency of Communication with Friends and Family: Not on file  . Frequency of Social Gatherings with Friends and Family: Not on file  . Attends Religious Services: Not on file  . Active Member of Clubs or Organizations: Not on file  . Attends Banker  Meetings: Not on file  . Marital Status: Not on file    Review of Systems  Constitutional: Negative for chills, fatigue and fever.  HENT: Negative for congestion, ear pain and sore throat.   Respiratory: Negative for cough and shortness of breath.   Cardiovascular: Positive for palpitations. Negative for chest pain.       Palpitations are occasional when patient is really tired.   Gastrointestinal: Positive for nausea. Negative for abdominal pain, constipation, diarrhea and vomiting.  Endocrine: Negative for polydipsia, polyphagia and polyuria.  Genitourinary: Negative for dysuria and urgency.  Musculoskeletal: Positive for arthralgias. Negative for myalgias.  Skin: Negative for rash.  Neurological: Positive for weakness and headaches. Negative for dizziness and syncope.  Psychiatric/Behavioral: Negative for dysphoric mood. The patient is not nervous/anxious.      Objective:  BP 124/76   Pulse 72   Temp (!) 97.2 F (36.2 C)   Resp 16   Ht 5\' 6"  (1.676 m)   Wt 197 lb 3.2 oz (89.4 kg)   BMI 31.83 kg/m   BP/Weight 08/28/2020 04/03/2020 02/12/2020  Systolic BP 124 110 133  Diastolic BP 76 64 82  Wt. (Lbs) 197.2 196 196  BMI 31.83 31.64 31.64    Physical Exam Vitals reviewed.  Constitutional:      General: She is not in acute distress.    Appearance: Normal appearance. She is obese.  HENT:     Right Ear: Tympanic membrane and ear canal normal.     Left Ear: Tympanic membrane and ear canal normal.     Nose: Nose normal. No congestion or rhinorrhea.  Eyes:     Conjunctiva/sclera: Conjunctivae normal.  Neck:     Thyroid: No thyroid mass.  Cardiovascular:     Rate and Rhythm: Normal rate and regular rhythm.     Pulses: Normal pulses.     Heart sounds: No murmur heard.   Pulmonary:     Effort: Pulmonary effort is normal.     Breath sounds: Normal breath sounds.  Abdominal:     General: Bowel sounds are normal.     Palpations: Abdomen is soft. There is no mass.      Tenderness: There is no abdominal tenderness.  Lymphadenopathy:     Cervical: No cervical adenopathy.  Neurological:     Mental Status: She is alert and oriented to person, place, and time.     Cranial Nerves: No cranial nerve deficit.     Motor: Weakness (left arm and left leg (4/5). rt arm and leg 5/5. ) present.  Psychiatric:        Mood and Affect: Mood normal.        Behavior: Behavior normal.      Lab Results  Component Value Date   WBC 3.8 08/28/2020   HGB 12.2 08/28/2020   HCT 38.9 08/28/2020   PLT 240 08/28/2020   GLUCOSE 88 08/28/2020   CHOL 172 08/28/2020  TRIG 76 08/28/2020   HDL 47 08/28/2020   LDLCALC 111 (H) 08/28/2020   ALT 20 08/28/2020   AST 22 08/28/2020   NA 141 08/28/2020   K 4.5 08/28/2020   CL 103 08/28/2020   CREATININE 0.93 08/28/2020   BUN 17 08/28/2020   CO2 20 08/28/2020   TSH 2.200 08/28/2020      Assessment & Plan:   1. Mixed hyperlipidemia Well controlled.  No medication changes recommended. Continue healthy diet and exercise.  - Lipid panel  2. Encounter for immunization - Flu Vaccine MDCK QUAD PF  3. SLE (systemic lupus erythematosus related syndrome) (HCC) Well controlled.  No changes to medicines.  Continue to work on eating a healthy diet and exercise.   4. Essential hypertension, benign Well controlled.  No changes to medicines.  Continue to work on eating a healthy diet and exercise.  Labs drawn today.  - CBC with Differential/Platelet - Comprehensive metabolic panel - TSH   5. Left sided weakness:  Recommended pt call neurology back and pursue further work up for MS.   Orders Placed This Encounter  Procedures  . Flu Vaccine MDCK QUAD PF  . CBC with Differential/Platelet  . Comprehensive metabolic panel  . Lipid panel  . TSH  . Cardiovascular Risk Assessment     Follow-up: Return in about 4 months (around 12/29/2020) for fasting . cpe.  An After Visit Summary was printed and given to the  patient.  Blane Ohara [facility] [phone]

## 2020-08-29 LAB — CBC WITH DIFFERENTIAL/PLATELET
Basophils Absolute: 0 10*3/uL (ref 0.0–0.2)
Basos: 1 %
EOS (ABSOLUTE): 0.1 10*3/uL (ref 0.0–0.4)
Eos: 3 %
Hematocrit: 38.9 % (ref 34.0–46.6)
Hemoglobin: 12.2 g/dL (ref 11.1–15.9)
Immature Grans (Abs): 0 10*3/uL (ref 0.0–0.1)
Immature Granulocytes: 0 %
Lymphocytes Absolute: 1.5 10*3/uL (ref 0.7–3.1)
Lymphs: 39 %
MCH: 29.9 pg (ref 26.6–33.0)
MCHC: 31.4 g/dL — ABNORMAL LOW (ref 31.5–35.7)
MCV: 95 fL (ref 79–97)
Monocytes Absolute: 0.4 10*3/uL (ref 0.1–0.9)
Monocytes: 11 %
Neutrophils Absolute: 1.7 10*3/uL (ref 1.4–7.0)
Neutrophils: 46 %
Platelets: 240 10*3/uL (ref 150–450)
RBC: 4.08 x10E6/uL (ref 3.77–5.28)
RDW: 11.8 % (ref 11.7–15.4)
WBC: 3.8 10*3/uL (ref 3.4–10.8)

## 2020-08-29 LAB — COMPREHENSIVE METABOLIC PANEL
ALT: 20 IU/L (ref 0–32)
AST: 22 IU/L (ref 0–40)
Albumin/Globulin Ratio: 1.6 (ref 1.2–2.2)
Albumin: 4.6 g/dL (ref 3.8–4.9)
Alkaline Phosphatase: 90 IU/L (ref 44–121)
BUN/Creatinine Ratio: 18 (ref 12–28)
BUN: 17 mg/dL (ref 8–27)
Bilirubin Total: 0.3 mg/dL (ref 0.0–1.2)
CO2: 20 mmol/L (ref 20–29)
Calcium: 9.6 mg/dL (ref 8.7–10.3)
Chloride: 103 mmol/L (ref 96–106)
Creatinine, Ser: 0.93 mg/dL (ref 0.57–1.00)
GFR calc Af Amer: 77 mL/min/{1.73_m2} (ref >59)
GFR calc non Af Amer: 67 mL/min/{1.73_m2} (ref >59)
Globulin, Total: 2.8 g/dL (ref 1.5–4.5)
Glucose: 88 mg/dL (ref 65–99)
Potassium: 4.5 mmol/L (ref 3.5–5.2)
Sodium: 141 mmol/L (ref 134–144)
Total Protein: 7.4 g/dL (ref 6.0–8.5)

## 2020-08-29 LAB — LIPID PANEL
Chol/HDL Ratio: 3.7 ratio (ref 0.0–4.4)
Cholesterol, Total: 172 mg/dL (ref 100–199)
HDL: 47 mg/dL (ref >39)
LDL Chol Calc (NIH): 111 mg/dL — ABNORMAL HIGH (ref 0–99)
Triglycerides: 76 mg/dL (ref 0–149)
VLDL Cholesterol Cal: 14 mg/dL (ref 5–40)

## 2020-08-29 LAB — TSH: TSH: 2.2 u[IU]/mL (ref 0.450–4.500)

## 2020-08-29 LAB — CARDIOVASCULAR RISK ASSESSMENT

## 2020-09-08 ENCOUNTER — Encounter: Payer: Self-pay | Admitting: Family Medicine

## 2020-09-30 ENCOUNTER — Other Ambulatory Visit: Payer: Self-pay | Admitting: Family Medicine

## 2020-10-06 ENCOUNTER — Other Ambulatory Visit: Payer: Self-pay | Admitting: Family Medicine

## 2020-11-06 ENCOUNTER — Encounter: Payer: Self-pay | Admitting: Family Medicine

## 2020-11-06 ENCOUNTER — Telehealth (INDEPENDENT_AMBULATORY_CARE_PROVIDER_SITE_OTHER): Payer: BC Managed Care – PPO | Admitting: Family Medicine

## 2020-11-06 VITALS — Ht 66.5 in | Wt 190.0 lb

## 2020-11-06 DIAGNOSIS — R5383 Other fatigue: Secondary | ICD-10-CM | POA: Diagnosis not present

## 2020-11-06 DIAGNOSIS — R059 Cough, unspecified: Secondary | ICD-10-CM | POA: Diagnosis not present

## 2020-11-06 DIAGNOSIS — Z20822 Contact with and (suspected) exposure to covid-19: Secondary | ICD-10-CM | POA: Diagnosis not present

## 2020-11-06 LAB — POC COVID19 BINAXNOW: SARS Coronavirus 2 Ag: NEGATIVE

## 2020-11-06 LAB — POCT INFLUENZA A/B
Influenza A, POC: NEGATIVE
Influenza B, POC: NEGATIVE

## 2020-11-06 NOTE — Progress Notes (Signed)
Pt states symptoms started yesterday.  Complains of fatigue, headache, sore throat (just yesterday), cough,   Denies fever, body aches, congestion.    Does not know of any exposure.  Has had 3 Covid shots, Had flu shot.  Virtual Visit via Telephone Note  I connected with [place] on 11/06/20 at  2:45 PM EST by telephone and verified that I am speaking with the correct person using two identifiers.  Location: Patient: home Provider: clinic   I discussed the limitations, risks, security and privacy concerns of performing an evaluation and management service by telephone and the availability of in person appointments. I also discussed with the patient that there may be a patient responsible charge related to this service. The patient expressed understanding and agreed to proceed.   History of Present Illness: Pt with nausea after lunch Lethargy upon waking-concern about listeria-worsening lethargy throughout the day Headache this morning-"dull headache"  SOB-intermittently Cough-slightly No fever Sore throat-slightly -yesterday "thought it was allergies" Pt with lower appetite No rash No loss of taste or snell Lupus-plaquenil  HTN-norvasc      Observations/Objective: No blood pressure today  Assessment and Plan: 1. Cough NEGATIVE POC-PCR pending-+ exposure - Influenza A/B - POC COVID-19 - Novel Coronavirus, NAA ([facility])  2. Fatigue, unspecified type - Influenza A/B - POC COVID-19 - Novel Coronavirus, NAA ([facility])  Follow Up Instructions:  I discussed the assessment and treatment plan with the patient. The patient was provided an opportunity to ask questions and all were answered. The patient agreed with the plan and demonstrated an understanding of the instructions.   The patient was advised to call back or seek an in-person evaluation if the symptoms worsen or if the condition fails to improve as anticipated.  I provided 7 minutes of non-face-to-face  time during this encounter. COVID guidelines discussed  Vianna Venezia Mat Carne, MD

## 2020-11-07 DIAGNOSIS — R059 Cough, unspecified: Secondary | ICD-10-CM

## 2020-11-07 DIAGNOSIS — R5383 Other fatigue: Secondary | ICD-10-CM | POA: Insufficient documentation

## 2020-11-07 DIAGNOSIS — R051 Acute cough: Secondary | ICD-10-CM | POA: Insufficient documentation

## 2020-11-07 HISTORY — DX: Cough, unspecified: R05.9

## 2020-11-07 HISTORY — DX: Acute cough: R05.1

## 2020-11-07 HISTORY — DX: Other fatigue: R53.83

## 2020-11-08 LAB — NOVEL CORONAVIRUS, NAA: SARS-CoV-2, NAA: NOT DETECTED

## 2020-11-08 LAB — SARS-COV-2, NAA 2 DAY TAT

## 2020-12-02 ENCOUNTER — Other Ambulatory Visit: Payer: Self-pay

## 2020-12-02 ENCOUNTER — Other Ambulatory Visit: Payer: Self-pay | Admitting: Family Medicine

## 2020-12-04 MED ORDER — HYDROXYCHLOROQUINE SULFATE 200 MG PO TABS
200.0000 mg | ORAL_TABLET | Freq: Two times a day (BID) | ORAL | 0 refills | Status: DC
Start: 2020-12-04 — End: 2021-03-19

## 2021-01-14 NOTE — Progress Notes (Signed)
Subjective:  Patient ID: Kathy Villa, female    DOB: Aug 26, 1961  Age: 60 y.o. MRN: [id number]  Chief Complaint  Patient presents with  . Hypertension    HPI Mixed hyperlipidemia Eating healthy. Frustrated by weight gain. Feels like her face is swollen.   Essential hypertension, benign Taking norvasc daily  SLE (systemic lupus erythematosus related syndrome) (HCC) Takes plaquenil  Complaining of left sided tightness in shoulder and feels pressure in left side of lumbar back. Takes ibuprofen and massage helps.  Left side of foot is sore when she steps out of bad. Walking eventually goes away.  At times has increased pressure in left eye comes and goes. Left maxillary sinus pressure. Started echinacia drops helps. Takes gymnema: curves sugar cravings. Started this past Saturday. It has already helpedd.  Vision worsened: due for eye exam.   GERD: Certain foods are causing heartburn.    Current Outpatient Medications on File Prior to Visit  Medication Sig Dispense Refill  . amLODipine (NORVASC) 5 MG tablet TAKE 1 TABLET(5 MG) BY MOUTH DAILY 90 tablet 1  . hydroxychloroquine (PLAQUENIL) 200 MG tablet Take 1 tablet (200 mg total) by mouth 2 (two) times daily. 180 tablet 0   No current facility-administered medications on file prior to visit.   Past Medical History:  Diagnosis Date  . Attention deficit hyperactivity disorder, predominantly inattentive type    prefers no medicines.  . Essential (primary) hypertension 2016  . Migraine with aura, intractable, without status migrainosus 1980  . Other specified nontoxic goiter   . Overweight   . Systemic lupus erythematosus, organ or system involvement unspecified (HCC) 2006   Past Surgical History:  Procedure Laterality Date  . CESAREAN SECTION      Family History  Problem Relation Age of Onset  . Congestive Heart Failure Father    Social History   Socioeconomic History  . Marital status: Married    Spouse name: Not  on file  . Number of children: 3  . Years of education: Not on file  . Highest education level: Not on file  Occupational History  . Occupation: Energy manager of [employer]  Tobacco Use  . Smoking status: Former Smoker    Types: Cigarettes    Quit date: 11/09/1982    Years since quitting: 38.2  . Smokeless tobacco: Never Used  Vaping Use  . Vaping Use: Never used  Substance and Sexual Activity  . Alcohol use: Not Currently  . Drug use: Never  . Sexual activity: Yes  Other Topics Concern  . Not on file  Social History Narrative   Right handed   Lives with husband   Social Determinants of Health   Financial Resource Strain: Not on file  Food Insecurity: Not on file  Transportation Needs: Not on file  Physical Activity: Not on file  Stress: Not on file  Social Connections: Not on file    Review of Systems  Constitutional: Negative for chills, fatigue and fever.  HENT: Negative for congestion, ear pain, rhinorrhea and sore throat.   Respiratory: Negative for cough and shortness of breath.   Cardiovascular: Negative for chest pain.  Gastrointestinal: Negative for abdominal pain, constipation, diarrhea, nausea and vomiting.  Genitourinary: Positive for frequency (At night). Negative for dysuria and urgency.  Musculoskeletal: Negative for back pain and myalgias.  Neurological: Positive for light-headedness and headaches. Negative for dizziness and weakness.  Psychiatric/Behavioral: Negative for dysphoric mood. The patient is not nervous/anxious.  Objective:  BP 128/64   Pulse 72   Temp (!) 97.3 F (36.3 C)   Ht 5\' 6"  (1.676 m)   Wt 199 lb (90.3 kg)   SpO2 98%   BMI 32.12 kg/m   BP/Weight 01/15/2021 11/06/2020 08/28/2020  Systolic BP 128 - 124  Diastolic BP 64 - 76  Wt. (Lbs) 199 190 197.2  BMI 32.12 30.21 31.83    Physical Exam Vitals reviewed.  Constitutional:      Appearance: Normal appearance. She is normal weight.  HENT:      Right Ear: Tympanic membrane normal.     Left Ear: Tympanic membrane normal.     Nose: Congestion present.     Comments: Left Sinus tenderness    Mouth/Throat:     Pharynx: Oropharyngeal exudate present. No posterior oropharyngeal erythema.  Neck:     Vascular: No carotid bruit.  Cardiovascular:     Rate and Rhythm: Normal rate and regular rhythm.     Pulses: Normal pulses.     Heart sounds: Normal heart sounds.  Pulmonary:     Effort: Pulmonary effort is normal. No respiratory distress.     Breath sounds: Normal breath sounds.  Abdominal:     General: Abdomen is flat. Bowel sounds are normal.     Palpations: Abdomen is soft.     Tenderness: There is no abdominal tenderness.  Musculoskeletal:     Comments: Upper back on left side.   Neurological:     Mental Status: She is alert and oriented to person, place, and time.  Psychiatric:        Mood and Affect: Mood normal.        Behavior: Behavior normal.   4/5 Left lower extremity and 4/5 left upper extremity. 5/5  Right side body: UE and LE.    Lab Results  Component Value Date   WBC 4.8 01/15/2021   HGB 13.0 01/15/2021   HCT 39.6 01/15/2021   PLT 271 01/15/2021   GLUCOSE 83 01/15/2021   CHOL 174 01/15/2021   TRIG 72 01/15/2021   HDL 53 01/15/2021   LDLCALC 107 (H) 01/15/2021   ALT 27 01/15/2021   AST 30 01/15/2021   NA 142 01/15/2021   K 4.2 01/15/2021   CL 104 01/15/2021   CREATININE 1.00 01/15/2021   BUN 15 01/15/2021   CO2 15 (L) 01/15/2021   TSH 2.360 01/15/2021      Assessment & Plan:   1. Mixed hyperlipidemia Well controlled.  No changes to medicines.  Continue to work on eating a healthy diet and exercise.  Labs drawn today.  - Lipid panel  2. Essential hypertension, benign Well controlled.  No changes to medicines.  Continue to work on eating a healthy diet and exercise.  Labs drawn today.  - Comprehensive metabolic panel - CBC with Differential/Platelet - TSH  3. SLE (systemic lupus  erythematosus related syndrome) (HCC) The current medical regimen is effective;  continue present plan and medications.  4. Acute cystitis without hematuria Rx: doxycycline. - Urine Culture - POCT urinalysis dipstick  5. Upper back pain on left side Start flexeril. Referral to PT.  - cyclobenzaprine (FLEXERIL) 5 MG tablet; Take 1 tablet (5 mg total) by mouth at bedtime as needed for muscle spasms.  Dispense: 30 tablet; Refill: 2 - Ambulatory referral to Physical Therapy  6. Acute non-recurrent sinusitis of other sinus - doxycycline (VIBRA-TABS) 100 MG tablet; Take 1 tablet (100 mg total) by mouth 2 (two) times daily.  Dispense:  20 tablet; Refill: 0  7. Plantar fasciitis of left foot Start on meloxicam. - meloxicam (MOBIC) 15 MG tablet; Take 1 tablet (15 mg total) by mouth daily.  Dispense: 30 tablet; Refill: 0  8. Left-sided weakness - MR Cervical Spine Wo Contrast  9. Paresthesia of right arm and leg Start on meloxicam 15 mg once daily.  - MR Cervical Spine Wo Contrast    Meds ordered this encounter  Medications  . meloxicam (MOBIC) 15 MG tablet    Sig: Take 1 tablet (15 mg total) by mouth daily.    Dispense:  30 tablet    Refill:  0  . cyclobenzaprine (FLEXERIL) 5 MG tablet    Sig: Take 1 tablet (5 mg total) by mouth at bedtime as needed for muscle spasms.    Dispense:  30 tablet    Refill:  2  . doxycycline (VIBRA-TABS) 100 MG tablet    Sig: Take 1 tablet (100 mg total) by mouth 2 (two) times daily.    Dispense:  20 tablet    Refill:  0    Orders Placed This Encounter  Procedures  . Urine Culture  . MR Cervical Spine Wo Contrast  . Lipid panel  . Comprehensive metabolic panel  . CBC with Differential/Platelet  . TSH  . Cardiovascular Risk Assessment  . Ambulatory referral to Physical Therapy  . POCT urinalysis dipstick     Follow-up: Return in about 3 months (around 04/17/2021), or if symptoms worsen or fail to improve, for fasting.  An After Visit Summary  was printed and given to the patient.  Blane Ohara, MD [facility] [phone]

## 2021-01-15 ENCOUNTER — Encounter: Payer: Self-pay | Admitting: Family Medicine

## 2021-01-15 ENCOUNTER — Other Ambulatory Visit: Payer: Self-pay

## 2021-01-15 ENCOUNTER — Ambulatory Visit (INDEPENDENT_AMBULATORY_CARE_PROVIDER_SITE_OTHER): Payer: BC Managed Care – PPO | Admitting: Family Medicine

## 2021-01-15 ENCOUNTER — Telehealth: Payer: Self-pay

## 2021-01-15 VITALS — BP 128/64 | HR 72 | Temp 97.3°F | Ht 66.0 in | Wt 199.0 lb

## 2021-01-15 DIAGNOSIS — M549 Dorsalgia, unspecified: Secondary | ICD-10-CM

## 2021-01-15 DIAGNOSIS — E782 Mixed hyperlipidemia: Secondary | ICD-10-CM | POA: Diagnosis not present

## 2021-01-15 DIAGNOSIS — J018 Other acute sinusitis: Secondary | ICD-10-CM

## 2021-01-15 DIAGNOSIS — M722 Plantar fascial fibromatosis: Secondary | ICD-10-CM

## 2021-01-15 DIAGNOSIS — M329 Systemic lupus erythematosus, unspecified: Secondary | ICD-10-CM

## 2021-01-15 DIAGNOSIS — N3000 Acute cystitis without hematuria: Secondary | ICD-10-CM | POA: Diagnosis not present

## 2021-01-15 DIAGNOSIS — R202 Paresthesia of skin: Secondary | ICD-10-CM

## 2021-01-15 DIAGNOSIS — I10 Essential (primary) hypertension: Secondary | ICD-10-CM

## 2021-01-15 DIAGNOSIS — R531 Weakness: Secondary | ICD-10-CM

## 2021-01-15 LAB — POCT URINALYSIS DIPSTICK
Bilirubin, UA: NEGATIVE
Blood, UA: NEGATIVE
Glucose, UA: NEGATIVE
Ketones, UA: NEGATIVE
Nitrite, UA: NEGATIVE
Protein, UA: POSITIVE — AB
Spec Grav, UA: 1.025 (ref 1.010–1.025)
Urobilinogen, UA: 0.2 E.U./dL
pH, UA: 6 (ref 5.0–8.0)

## 2021-01-15 MED ORDER — MELOXICAM 15 MG PO TABS: 15.0000 mg | ORAL_TABLET | Freq: Every day | ORAL | 0 refills | Status: DC

## 2021-01-15 MED ORDER — DOXYCYCLINE HYCLATE 100 MG PO TABS: 100.0000 mg | ORAL_TABLET | Freq: Two times a day (BID) | ORAL | 0 refills | Status: DC

## 2021-01-15 MED ORDER — CYCLOBENZAPRINE HCL 5 MG PO TABS: 5.0000 mg | ORAL_TABLET | Freq: Every evening | ORAL | 2 refills | Status: DC | PRN

## 2021-01-15 NOTE — Patient Instructions (Addendum)
Plantar Fasciitis: Start on meloxicam 15 mg once daily. Get a heel lift for left foot.  GERD: use omeprazole prior to gerd related foods.  Left shoulder pain/upper back pain: Refer to physical therapy. Left sided symptoms: recommend mri of cspine.  Other sinusitis: Doxycycline rx.  Allergic rhinitis: otc allegra, claritin, or zyrtec as needed.   Gastroesophageal Reflux Disease, Adult  Gastroesophageal reflux (GER) happens when acid from the stomach flows up into the tube that connects the mouth and the stomach (esophagus). Normally, food travels down the esophagus and stays in the stomach to be digested. With GER, food and stomach acid sometimes move back up into the esophagus. You may have a disease called gastroesophageal reflux disease (GERD) if the reflux:  Happens often.  Causes frequent or very bad symptoms.  Causes problems such as damage to the esophagus. When this happens, the esophagus becomes sore and swollen. Over time, GERD can make small holes (ulcers) in the lining of the esophagus. What are the causes? This condition is caused by a problem with the muscle between the esophagus and the stomach. When this muscle is weak or not normal, it does not close properly to keep food and acid from coming back up from the stomach. The muscle can be weak because of:  Tobacco use.  Pregnancy.  Having a certain type of hernia (hiatal hernia).  Alcohol use.  Certain foods and drinks, such as coffee, chocolate, onions, and peppermint. What increases the risk?  Being overweight.  Having a disease that affects your connective tissue.  Taking NSAIDs, such a ibuprofen. What are the signs or symptoms?  Heartburn.  Difficult or painful swallowing.  The feeling of having a lump in the throat.  A bitter taste in the mouth.  Bad breath.  Having a lot of saliva.  Having an upset or bloated stomach.  Burping.  Chest pain. Different conditions can cause chest pain. Make sure  you see your doctor if you have chest pain.  Shortness of breath or wheezing.  A long-term cough or a cough at night.  Wearing away of the surface of teeth (tooth enamel).  Weight loss. How is this treated?  Making changes to your diet.  Taking medicine.  Having surgery. Treatment will depend on how bad your symptoms are. Follow these instructions at home: Eating and drinking  Follow a diet as told by your doctor. You may need to avoid foods and drinks such as: ? Coffee and tea, with or without caffeine. ? Drinks that contain alcohol. ? Energy drinks and sports drinks. ? Bubbly (carbonated) drinks or sodas. ? Chocolate and cocoa. ? Peppermint and mint flavorings. ? Garlic and onions. ? Horseradish. ? Spicy and acidic foods. These include peppers, chili powder, curry powder, vinegar, hot sauces, and BBQ sauce. ? Citrus fruit juices and citrus fruits, such as oranges, lemons, and limes. ? Tomato-based foods. These include red sauce, chili, salsa, and pizza with red sauce. ? Fried and fatty foods. These include donuts, french fries, potato chips, and high-fat dressings. ? High-fat meats. These include hot dogs, rib eye steak, sausage, ham, and bacon. ? High-fat dairy items, such as whole milk, butter, and cream cheese.  Eat small meals often. Avoid eating large meals.  Avoid drinking large amounts of liquid with your meals.  Avoid eating meals during the 2-3 hours before bedtime.  Avoid lying down right after you eat.  Do not exercise right after you eat.   Lifestyle  Do not smoke or use  any products that contain nicotine or tobacco. If you need help quitting, ask your doctor.  Try to lower your stress. If you need help doing this, ask your doctor.  If you are overweight, lose an amount of weight that is healthy for you. Ask your doctor about a safe weight loss goal.   General instructions  Pay attention to any changes in your symptoms.  Take over-the-counter and  prescription medicines only as told by your doctor.  Do not take aspirin, ibuprofen, or other NSAIDs unless your doctor says it is okay.  Wear loose clothes. Do not wear anything tight around your waist.  Raise (elevate) the head of your bed about 6 inches (15 cm). You may need to use a wedge to do this.  Avoid bending over if this makes your symptoms worse.  Keep all follow-up visits. Contact a doctor if:  You have new symptoms.  You lose weight and you do not know why.  You have trouble swallowing or it hurts to swallow.  You have wheezing or a cough that keeps happening.  You have a hoarse voice.  Your symptoms do not get better with treatment. Get help right away if:  You have sudden pain in your arms, neck, jaw, teeth, or back.  You suddenly feel sweaty, dizzy, or light-headed.  You have chest pain or shortness of breath.  You vomit and the vomit is green, yellow, or black, or it looks like blood or coffee grounds.  You faint.  Your poop (stool) is red, bloody, or black.  You cannot swallow, drink, or eat. These symptoms may represent a serious problem that is an emergency. Do not wait to see if the symptoms will go away. Get medical help right away. Call your local emergency services (911 in the [place] Do not drive yourself to the hospital. Summary  If a person has gastroesophageal reflux disease (GERD), food and stomach acid move back up into the esophagus and cause symptoms or problems such as damage to the esophagus.  Treatment will depend on how bad your symptoms are.  Follow a diet as told by your doctor.  Take all medicines only as told by your doctor. This information is not intended to replace advice given to you by your health care provider. Make sure you discuss any questions you have with your health care provider. Document Revised: 05/06/2020 Document Reviewed: 05/06/2020 Elsevier Patient Education  2021 [employer] Inc.   Plantar  Fasciitis  Plantar fasciitis is a painful foot condition that affects the heel. It occurs when the band of tissue that connects the toes to the heel bone (plantar fascia) becomes irritated. This can happen as the result of exercising too much or doing other repetitive activities (overuse injury). Plantar fasciitis can cause mild irritation to severe pain that makes it difficult to walk or move. The pain is usually worse in the morning after sleeping, or after sitting or lying down for a period of time. Pain may also be worse after long periods of walking or standing. What are the causes? This condition may be caused by:  Standing for long periods of time.  Wearing shoes that do not have good arch support.  Doing activities that put stress on joints (high-impact activities). This includes ballet and exercise that makes your heart beat faster (aerobic exercise), such as running.  Being overweight.  An abnormal way of walking (gait).  Tight muscles in the back of your lower leg (calf).  High arches in your  feet or flat feet.  Starting a new athletic activity. What are the signs or symptoms? The main symptom of this condition is heel pain. Pain may get worse after the following:  Taking the first steps after a time of rest, especially in the morning after awakening, or after you have been sitting or lying down for a while.  Long periods of standing still. Pain may decrease after 30-45 minutes of activity, such as gentle walking. How is this diagnosed? This condition may be diagnosed based on your medical history, a physical exam, and your symptoms. Your health care provider will check for:  A tender area on the bottom of your foot.  A high arch in your foot or flat feet.  Pain when you move your foot.  Difficulty moving your foot. You may have imaging tests to confirm the diagnosis, such as:  X-rays.  Ultrasound.  MRI. How is this treated? Treatment for plantar fasciitis  depends on how severe your condition is. Treatment may include:  Rest, ice, pressure (compression), and raising (elevating) the affected foot. This is called RICE therapy. Your health care provider may recommend RICE therapy along with over-the-counter pain medicines to manage your pain.  Exercises to stretch your calves and your plantar fascia.  A splint that holds your foot in a stretched, upward position while you sleep (night splint).  Physical therapy to relieve symptoms and prevent problems in the future.  Injections of steroid medicine (cortisone) to relieve pain and inflammation.  Stimulating your plantar fascia with electrical impulses (extracorporeal shock wave therapy). This is usually the last treatment option before surgery.  Surgery, if other treatments have not worked after 12 months. Follow these instructions at home: Managing pain, stiffness, and swelling  If directed, put ice on the painful area. To do this: ? Put ice in a plastic bag, or use a frozen bottle of water. ? Place a towel between your skin and the bag or bottle. ? Roll the bottom of your foot over the bag or bottle. ? Do this for 20 minutes, 2-3 times a day.  Wear athletic shoes that have air-sole or gel-sole cushions, or try soft shoe inserts that are designed for plantar fasciitis.  Elevate your foot above the level of your heart while you are sitting or lying down.   Activity  Avoid activities that cause pain. Ask your health care provider what activities are safe for you.  Do physical therapy exercises and stretches as told by your health care provider.  Try activities and forms of exercise that are easier on your joints (low impact). Examples include swimming, water aerobics, and biking. General instructions  Take over-the-counter and prescription medicines only as told by your health care provider.  Wear a night splint while sleeping, if told by your health care provider. Loosen the splint if  your toes tingle, become numb, or turn cold and blue.  Maintain a healthy weight, or work with your health care provider to lose weight as needed.  Keep all follow-up visits. This is important. Contact a health care provider if you have:  Symptoms that do not go away with home treatment.  Pain that gets worse.  Pain that affects your ability to move or do daily activities. Summary  Plantar fasciitis is a painful foot condition that affects the heel. It occurs when the band of tissue that connects the toes to the heel bone (plantar fascia) becomes irritated.  Heel pain is the main symptom of this condition. It  may get worse after exercising too much or standing still for a long time.  Treatment varies, but it usually starts with rest, ice, pressure (compression), and raising (elevating) the affected foot. This is called RICE therapy. Over-the-counter medicines can also be used to manage pain. This information is not intended to replace advice given to you by your health care provider. Make sure you discuss any questions you have with your health care provider. Document Revised: 02/12/2020 Document Reviewed: 02/12/2020 Elsevier Patient Education  2021 [employer]

## 2021-01-15 NOTE — Telephone Encounter (Signed)
   Kathy Villa has been scheduled for the following appointment:  WHAT: MRI Cervical W/O Contrast WHERE: [facility] DATE: 01/16/2021 TIME: 11:45am  Patient has been made aware.

## 2021-01-16 LAB — LIPID PANEL
Chol/HDL Ratio: 3.3 ratio (ref 0.0–4.4)
Cholesterol, Total: 174 mg/dL (ref 100–199)
HDL: 53 mg/dL (ref >39)
LDL Chol Calc (NIH): 107 mg/dL — ABNORMAL HIGH (ref 0–99)
Triglycerides: 72 mg/dL (ref 0–149)
VLDL Cholesterol Cal: 14 mg/dL (ref 5–40)

## 2021-01-16 LAB — COMPREHENSIVE METABOLIC PANEL
ALT: 27 IU/L (ref 0–32)
AST: 30 IU/L (ref 0–40)
Albumin/Globulin Ratio: 1.5 (ref 1.2–2.2)
Albumin: 4.6 g/dL (ref 3.8–4.9)
Alkaline Phosphatase: 95 IU/L (ref 44–121)
BUN/Creatinine Ratio: 15 (ref 12–28)
BUN: 15 mg/dL (ref 8–27)
Bilirubin Total: 0.5 mg/dL (ref 0.0–1.2)
CO2: 15 mmol/L — ABNORMAL LOW (ref 20–29)
Calcium: 9.7 mg/dL (ref 8.7–10.3)
Chloride: 104 mmol/L (ref 96–106)
Creatinine, Ser: 1 mg/dL (ref 0.57–1.00)
Globulin, Total: 3 g/dL (ref 1.5–4.5)
Glucose: 83 mg/dL (ref 65–99)
Potassium: 4.2 mmol/L (ref 3.5–5.2)
Sodium: 142 mmol/L (ref 134–144)
Total Protein: 7.6 g/dL (ref 6.0–8.5)
eGFR: 64 mL/min/{1.73_m2} (ref >59)

## 2021-01-16 LAB — CBC WITH DIFFERENTIAL/PLATELET
Basophils Absolute: 0 10*3/uL (ref 0.0–0.2)
Basos: 1 %
EOS (ABSOLUTE): 0.1 10*3/uL (ref 0.0–0.4)
Eos: 3 %
Hematocrit: 39.6 % (ref 34.0–46.6)
Hemoglobin: 13 g/dL (ref 11.1–15.9)
Immature Grans (Abs): 0 10*3/uL (ref 0.0–0.1)
Immature Granulocytes: 0 %
Lymphocytes Absolute: 1.7 10*3/uL (ref 0.7–3.1)
Lymphs: 34 %
MCH: 30.4 pg (ref 26.6–33.0)
MCHC: 32.8 g/dL (ref 31.5–35.7)
MCV: 93 fL (ref 79–97)
Monocytes Absolute: 0.4 10*3/uL (ref 0.1–0.9)
Monocytes: 9 %
Neutrophils Absolute: 2.6 10*3/uL (ref 1.4–7.0)
Neutrophils: 53 %
Platelets: 271 10*3/uL (ref 150–450)
RBC: 4.27 x10E6/uL (ref 3.77–5.28)
RDW: 12.2 % (ref 11.7–15.4)
WBC: 4.8 10*3/uL (ref 3.4–10.8)

## 2021-01-16 LAB — CARDIOVASCULAR RISK ASSESSMENT

## 2021-01-16 LAB — TSH: TSH: 2.36 u[IU]/mL (ref 0.450–4.500)

## 2021-01-17 LAB — URINE CULTURE

## 2021-02-06 ENCOUNTER — Encounter: Payer: Self-pay | Admitting: Nurse Practitioner

## 2021-02-06 ENCOUNTER — Ambulatory Visit: Payer: BC Managed Care – PPO | Admitting: Nurse Practitioner

## 2021-02-06 ENCOUNTER — Other Ambulatory Visit: Payer: Self-pay

## 2021-02-06 ENCOUNTER — Telehealth: Payer: Self-pay

## 2021-02-06 VITALS — BP 122/78 | HR 85 | Temp 97.7°F | Resp 16 | Ht 66.0 in | Wt 196.6 lb

## 2021-02-06 DIAGNOSIS — R0600 Dyspnea, unspecified: Secondary | ICD-10-CM

## 2021-02-06 DIAGNOSIS — E663 Overweight: Secondary | ICD-10-CM | POA: Insufficient documentation

## 2021-02-06 DIAGNOSIS — F900 Attention-deficit hyperactivity disorder, predominantly inattentive type: Secondary | ICD-10-CM | POA: Insufficient documentation

## 2021-02-06 DIAGNOSIS — I498 Other specified cardiac arrhythmias: Secondary | ICD-10-CM

## 2021-02-06 DIAGNOSIS — R0609 Other forms of dyspnea: Secondary | ICD-10-CM

## 2021-02-06 DIAGNOSIS — I493 Ventricular premature depolarization: Secondary | ICD-10-CM | POA: Diagnosis not present

## 2021-02-06 DIAGNOSIS — E048 Other specified nontoxic goiter: Secondary | ICD-10-CM | POA: Insufficient documentation

## 2021-02-06 NOTE — Telephone Encounter (Signed)
Pt was seen by Flonnie Hailstone, NP.  Sent to cardiology. Kc

## 2021-02-06 NOTE — Patient Instructions (Addendum)
Seek emergency medical care for any concerns We will call you with a cardiology referral appointment Decrease any caffeine intake Follow-up 04/18/21 as scheduled  Palpitations Palpitations are feelings that your heartbeat is not normal. Your heartbeat may feel like it is:  Uneven.  Faster than normal.  Fluttering.  Skipping a beat. This is usually not a serious problem. In some cases, you may need tests to rule out any serious problems. Follow these instructions at home: Pay attention to any changes in your condition. Take these actions to help manage your symptoms: Eating and drinking  Avoid: ? Coffee, tea, soft drinks, and energy drinks. ? Chocolate. ? Alcohol. ? Diet pills. Lifestyle  Try to lower your stress. These things can help you relax: ? Yoga. ? Deep breathing and meditation. ? Exercise. ? Using words and images to create positive thoughts (guided imagery). ? Using your mind to control things in your body (biofeedback).  Do not use drugs.  Get plenty of rest and sleep. Keep a regular bed time.   General instructions  Take over-the-counter and prescription medicines only as told by your doctor.  Do not use any products that contain nicotine or tobacco, such as cigarettes and e-cigarettes. If you need help quitting, ask your doctor.  Keep all follow-up visits as told by your doctor. This is important. You may need more tests if palpitations do not go away or get worse.   Contact a doctor if:  Your symptoms last more than 24 hours.  Your symptoms occur more often. Get help right away if you:  Have chest pain.  Feel short of breath.  Have a very bad headache.  Feel dizzy.  Pass out (faint). Summary  Palpitations are feelings that your heartbeat is uneven or faster than normal. It may feel like your heart is fluttering or skipping a beat.  Avoid food and drinks that may cause palpitations. These include caffeine, chocolate, and alcohol.  Try to  lower your stress. Do not smoke or use drugs.  Get help right away if you faint or have chest pain, shortness of breath, a severe headache, or dizziness. This information is not intended to replace advice given to you by your health care provider. Make sure you discuss any questions you have with your health care provider. Document Revised: 12/08/2017 Document Reviewed: 12/08/2017 Elsevier Patient Education  2021 [employer] Premature Ventricular Contraction  A premature ventricular contraction (PVC) is a common kind of irregular heartbeat (arrhythmia). These contractions are extra heartbeats that start in the ventricles of the heart and occur too early in the normal sequence. During the PVC, the heart's normal electrical pathway is not used, so the beat is shorter and less effective. In most cases, these contractions come and go and do not require treatment. What are the causes? Common causes of the condition include:  Smoking.  Drinking alcohol.  Certain medicines.  Some illegal drugs.  Stress.  Caffeine. Certain medical conditions can also cause PVCs:  Heart failure.  Heart attack, or coronary artery disease.  Heart valve problems.  Changes in minerals in the blood (electrolytes).  Low blood oxygen levels or high carbon dioxide levels. In many cases, the cause of this condition is not known. What are the signs or symptoms? The main symptom of this condition is fast or skipped heartbeats (palpitations). Other symptoms include:  Chest pain.  Shortness of breath.  Feeling tired.  Dizziness.  Difficulty exercising. In some cases, there are no symptoms. How is this diagnosed?  This condition may be diagnosed based on:  Your medical history.  A physical exam. During the exam, the health care provider will check for irregular heartbeats.  Tests, such as: ? An ECG (electrocardiogram) to monitor the electrical activity of your heart. ? An ambulatory cardiac  monitor. This device records your heartbeats for 24 hours or more. ? Stress tests to see how exercise affects your heart rhythm and blood supply. ? An echocardiogram. This test uses sound waves (ultrasound) to produce an image of your heart. ? An electrophysiology study (EPS). This test checks for electrical problems in your heart. How is this treated? Treatment for this condition depends on any underlying conditions, the type of PVCs that you are having, and how much the symptoms are interfering with your daily life. Possible treatments include:  Avoiding things that cause premature contractions (triggers). These include caffeine and alcohol.  Taking medicines if symptoms are severe or if the extra heartbeats are frequent.  Getting treatment for underlying conditions that cause PVCs.  Having an implantable cardioverter defibrillator (ICD), if you are at risk for a serious arrhythmia. The ICD is a small device that is inserted into your chest to monitor your heartbeat. When it senses an irregular heartbeat, it sends a shock to bring the heartbeat back to normal.  Having a procedure to destroy the portion of the heart tissue that sends out abnormal signals (catheter ablation). In some cases, no treatment is required. Follow these instructions at home: Lifestyle  Do not use any products that contain nicotine or tobacco, such as cigarettes, e-cigarettes, and chewing tobacco. If you need help quitting, ask your health care provider.  Do not use illegal drugs.  Exercise regularly. Ask your health care provider what type of exercise is safe for you.  Try to get at least 7-9 hours of sleep each night, or as much as recommended by your health care provider.  Find healthy ways to manage stress. Avoid stressful situations when possible. Alcohol use  Do not drink alcohol if: ? Your health care provider tells you not to drink. ? You are pregnant, may be pregnant, or are planning to become  pregnant. ? Alcohol triggers your episodes.  If you drink alcohol: ? Limit how much you use to:  0-1 drink a day for women.  0-2 drinks a day for men.  Be aware of how much alcohol is in your drink. In the [place] one drink equals one 12 oz bottle of beer (355 mL), one 5 oz glass of wine (148 mL), or one 1 oz glass of hard liquor (44 mL). General instructions  Take over-the-counter and prescription medicines only as told by your health care provider.  If caffeine triggers episodes of PVC, do not eat, drink, or use anything with caffeine in it.  Keep all follow-up visits as told by your health care provider. This is important. Contact a health care provider if you:  Feel palpitations. Get help right away if you:  Have chest pain.  Have shortness of breath.  Have sweating for no reason.  Have nausea and vomiting.  Become light-headed or you faint. Summary  A premature ventricular contraction (PVC) is a common kind of irregular heartbeat (arrhythmia).  In most cases, these contractions come and go and do not require treatment.  You may need to wear an ambulatory cardiac monitor. This records your heartbeats for 24 hours or more.  Treatment depends on any underlying conditions, the type of PVCs that you are having, and  how much the symptoms are interfering with your daily life. This information is not intended to replace advice given to you by your health care provider. Make sure you discuss any questions you have with your health care provider. Document Revised: 07/21/2018 Document Reviewed: 07/21/2018 Elsevier Patient Education  2021 [employer].

## 2021-02-06 NOTE — Telephone Encounter (Signed)
Error

## 2021-02-06 NOTE — Progress Notes (Signed)
Acute Office Visit  Subjective:    Patient ID: Kathy Villa, female    DOB: May 04, 1961, 60 y.o.   MRN: [id number]  Chief Complaint  Patient presents with  . Palpitations        HPI Kathy Villa is a 60 year old [place] American female that presents with an irregular pulse and dyspnea on exertion. Onset of symptoms was 4-days ago. She states she is experiencing fatigue. She denies chest pain, nausea, dizziness, or syncope. She states she has chronic left side weakness that has been investigated without discovering etiology. She has a past medical history of Lupus, HTN, and osteoarthritis. She tells me she does take an ASA 81 mg daily.  Past Medical History:  Diagnosis Date  . Attention deficit hyperactivity disorder, predominantly inattentive type    prefers no medicines.  . Essential (primary) hypertension 2016  . Migraine with aura, intractable, without status migrainosus 1980  . Other specified nontoxic goiter   . Overweight   . Systemic lupus erythematosus, organ or system involvement unspecified (HCC) 2006    Past Surgical History:  Procedure Laterality Date  . CESAREAN SECTION      Family History  Problem Relation Age of Onset  . Congestive Heart Failure Father     Social History   Socioeconomic History  . Marital status: Married    Spouse name: Not on file  . Number of children: 3  . Years of education: Not on file  . Highest education level: Not on file  Occupational History  . Occupation: Energy manager of [employer]  Tobacco Use  . Smoking status: Former Smoker    Types: Cigarettes    Quit date: 11/09/1982    Years since quitting: 38.2  . Smokeless tobacco: Never Used  Vaping Use  . Vaping Use: Never used  Substance and Sexual Activity  . Alcohol use: Not Currently  . Drug use: Never  . Sexual activity: Yes  Other Topics Concern  . Not on file  Social History Narrative   Right handed   Lives with husband      Outpatient  Medications Prior to Visit  Medication Sig Dispense Refill  . amLODipine (NORVASC) 5 MG tablet TAKE 1 TABLET(5 MG) BY MOUTH DAILY 90 tablet 1  . cyclobenzaprine (FLEXERIL) 5 MG tablet Take 1 tablet (5 mg total) by mouth at bedtime as needed for muscle spasms. 30 tablet 2  . hydroxychloroquine (PLAQUENIL) 200 MG tablet Take 1 tablet (200 mg total) by mouth 2 (two) times daily. 180 tablet 0  . meloxicam (MOBIC) 15 MG tablet Take 1 tablet (15 mg total) by mouth daily. 30 tablet 0  . doxycycline (VIBRA-TABS) 100 MG tablet Take 1 tablet (100 mg total) by mouth 2 (two) times daily. 20 tablet 0   No facility-administered medications prior to visit.    No Known Allergies  Review of Systems  Constitutional: Positive for fatigue. Negative for appetite change and unexpected weight change.  HENT: Negative for congestion, ear pain, rhinorrhea, sinus pressure, sinus pain and tinnitus.   Eyes: Negative for pain.  Respiratory: Positive for shortness of breath (with activity). Negative for cough.   Cardiovascular: Positive for palpitations (intermittent). Negative for chest pain and leg swelling.  Gastrointestinal: Negative for abdominal pain, constipation, diarrhea, nausea and vomiting.  Endocrine: Negative for cold intolerance, heat intolerance, polydipsia, polyphagia and polyuria.  Genitourinary: Negative for dysuria, frequency and hematuria.  Musculoskeletal: Negative for arthralgias, back pain, joint swelling and myalgias.  Skin: Negative for rash.  Allergic/Immunologic: Negative for environmental allergies.  Neurological: Positive for weakness (chronic left side). Negative for dizziness and headaches.  Hematological: Negative for adenopathy.  Psychiatric/Behavioral: Negative for decreased concentration and sleep disturbance. The patient is not nervous/anxious.        Objective:    Physical Exam Vitals reviewed.  Constitutional:      Appearance: Normal appearance.  HENT:     Head:  Normocephalic.     Nose: Nose normal.     Mouth/Throat:     Mouth: Mucous membranes are moist.  Eyes:     Pupils: Pupils are equal, round, and reactive to light.  Cardiovascular:     Rate and Rhythm: Normal rate. Rhythm irregular.     Pulses: Normal pulses.  Pulmonary:     Effort: Pulmonary effort is normal.     Breath sounds: Normal breath sounds.  Abdominal:     General: Bowel sounds are normal.     Palpations: Abdomen is soft.  Musculoskeletal:        General: Normal range of motion.     Cervical back: Neck supple.  Skin:    General: Skin is warm and dry.     Capillary Refill: Capillary refill takes less than 2 seconds.  Neurological:     General: No focal deficit present.     Mental Status: She is alert and oriented to person, place, and time.     Motor: No tremor.     Gait: Gait is intact.  Psychiatric:        Mood and Affect: Mood normal.        Behavior: Behavior normal.     BP 122/78   Pulse 85   Temp 97.7 F (36.5 C) (Temporal)   Resp 16   Ht 5\' 6"  (1.676 m)   Wt 196 lb 9.6 oz (89.2 kg)   SpO2 97%   BMI 31.73 kg/m  Wt Readings from Last 3 Encounters:  02/06/21 196 lb 9.6 oz (89.2 kg)  01/15/21 199 lb (90.3 kg)  11/06/20 190 lb (86.2 kg)    Health Maintenance Due  Topic Date Due  . Hepatitis C Screening  Never done  . HIV Screening  Never done  . TETANUS/TDAP  Never done  . PAP SMEAR-Modifier  06/09/2014      Lab Results  Component Value Date   TSH 2.360 01/15/2021   Lab Results  Component Value Date   WBC 4.8 01/15/2021   HGB 13.0 01/15/2021   HCT 39.6 01/15/2021   MCV 93 01/15/2021   PLT 271 01/15/2021   Lab Results  Component Value Date   NA 142 01/15/2021   K 4.2 01/15/2021   CO2 15 (L) 01/15/2021   GLUCOSE 83 01/15/2021   BUN 15 01/15/2021   CREATININE 1.00 01/15/2021   BILITOT 0.5 01/15/2021   ALKPHOS 95 01/15/2021   AST 30 01/15/2021   ALT 27 01/15/2021   PROT 7.6 01/15/2021   ALBUMIN 4.6 01/15/2021   CALCIUM 9.7  01/15/2021   Lab Results  Component Value Date   CHOL 174 01/15/2021   Lab Results  Component Value Date   HDL 53 01/15/2021   Lab Results  Component Value Date   LDLCALC 107 (H) 01/15/2021   Lab Results  Component Value Date   TRIG 72 01/15/2021   Lab Results  Component Value Date   CHOLHDL 3.3 01/15/2021        Assessment & Plan:   1. Other cardiac arrhythmia - Ambulatory referral to Cardiology -  EKG 12-Lead  2. PVC's (premature ventricular contractions) - Ambulatory referral to Cardiology - EKG 12-Lead  3. Dyspnea on exertion - Ambulatory referral to Cardiology   Seek emergency medical care for any concerns We will call you with a cardiology referral appointment Decrease any caffeine intake Follow-up 04/18/21 as scheduled  Follow-up: 04/18/21 as scheduled or sooner if needed  Signed, Janie Morning, NP

## 2021-02-06 NOTE — Telephone Encounter (Signed)
Pt transferred to triage as she called in for palpitations.   Pt is complaining of continuous palpitations w/ weakness and fatigue. States it has been happening for a couple days and has felt like this before when she drunk caffeine but has not had caffeine. Pt denies chest pain or tightness, headache. Pt states she is in a class.  Advised pt come into the office as soon as possible for EKG. Pt VU and appointment scheduled for 9:30.   Routing to PCP and encounter provider as Lorain Childes.  Terrill Mohr 02/06/21 8:49 AM

## 2021-02-07 ENCOUNTER — Encounter: Payer: Self-pay | Admitting: Cardiology

## 2021-02-07 ENCOUNTER — Ambulatory Visit: Payer: BC Managed Care – PPO

## 2021-02-07 ENCOUNTER — Ambulatory Visit: Payer: BC Managed Care – PPO | Admitting: Cardiology

## 2021-02-07 VITALS — BP 136/88 | HR 72 | Ht 66.6 in | Wt 197.8 lb

## 2021-02-07 DIAGNOSIS — E66811 Obesity, class 1: Secondary | ICD-10-CM

## 2021-02-07 DIAGNOSIS — E669 Obesity, unspecified: Secondary | ICD-10-CM

## 2021-02-07 DIAGNOSIS — R002 Palpitations: Secondary | ICD-10-CM

## 2021-02-07 DIAGNOSIS — R0609 Other forms of dyspnea: Secondary | ICD-10-CM | POA: Insufficient documentation

## 2021-02-07 DIAGNOSIS — I10 Essential (primary) hypertension: Secondary | ICD-10-CM | POA: Diagnosis not present

## 2021-02-07 DIAGNOSIS — M3210 Systemic lupus erythematosus, organ or system involvement unspecified: Secondary | ICD-10-CM

## 2021-02-07 DIAGNOSIS — G459 Transient cerebral ischemic attack, unspecified: Secondary | ICD-10-CM | POA: Diagnosis not present

## 2021-02-07 DIAGNOSIS — R0600 Dyspnea, unspecified: Secondary | ICD-10-CM | POA: Insufficient documentation

## 2021-02-07 DIAGNOSIS — R011 Cardiac murmur, unspecified: Secondary | ICD-10-CM

## 2021-02-07 DIAGNOSIS — E782 Mixed hyperlipidemia: Secondary | ICD-10-CM | POA: Diagnosis not present

## 2021-02-07 HISTORY — DX: Obesity, class 1: E66.811

## 2021-02-07 HISTORY — DX: Other forms of dyspnea: R06.09

## 2021-02-07 HISTORY — DX: Cardiac murmur, unspecified: R01.1

## 2021-02-07 HISTORY — DX: Palpitations: R00.2

## 2021-02-07 NOTE — Patient Instructions (Signed)
Medication Instructions:  No medication changes. *If you need a refill on your cardiac medications before your next appointment, please call your pharmacy*   Lab Work: None ordered If you have labs (blood work) drawn today and your tests are completely normal, you will receive your results only by: Marland Kitchen MyChart Message (if you have MyChart) OR . A paper copy in the mail If you have any lab test that is abnormal or we need to change your treatment, we will call you to review the results.   Testing/Procedures: Your physician has requested that you have an echocardiogram. Echocardiography is a painless test that uses sound waves to create images of your heart. It provides your doctor with information about the size and shape of your heart and how well your heart's chambers and valves are working. This procedure takes approximately one hour. There are no restrictions for this procedure.  Your physician has requested that you have a lexiscan myoview. For further information please visit [email or web address] Please follow instruction sheet, as given.  The test will take approximately 3 to 4 hours to complete; you may bring reading material.  If someone comes with you to your appointment, they will need to remain in the main lobby due to limited space in the testing area.  How to prepare for your Myocardial Perfusion Test: . Do not eat or drink 3 hours prior to your test, except you may have water. . Do not consume products containing caffeine (regular or decaffeinated) 12 hours prior to your test. (ex: coffee, chocolate, sodas, tea). . Do bring a list of your current medications with you.  If not listed below, you may take your medications as normal. . Do wear comfortable clothes (no dresses or overalls) and walking shoes, tennis shoes preferred (No heels or open toe shoes are allowed). . Do NOT wear cologne, perfume, aftershave, or lotions (deodorant is allowed). . If these instructions are not  followed, your test will have to be rescheduled.   WHY IS MY DOCTOR PRESCRIBING ZIO? The Zio system is proven and trusted by physicians to detect and diagnose irregular heart rhythms -- and has been prescribed to hundreds of thousands of patients.  The FDA has cleared the Zio system to monitor for many different kinds of irregular heart rhythms. In a study, physicians were able to reach a diagnosis 90% of the time with the Zio system1.  You can wear the Zio monitor -- a small, discreet, comfortable patch -- during your normal day-to-day activity, including while you sleep, shower, and exercise, while it records every single heartbeat for analysis.  1Barrett, P., et al. Comparison of 24 Hour Holter Monitoring Versus 14 Day Novel Adhesive Patch Electrocardiographic Monitoring. American Journal of Medicine, 2014.  ZIO VS. HOLTER MONITORING The Zio monitor can be comfortably worn for up to 14 days. Holter monitors can be worn for 24 to 48 hours, limiting the time to record any irregular heart rhythms you may have. Zio is able to capture data for the 51% of patients who have their first symptom-triggered arrhythmia after 48 hours.1  LIVE WITHOUT RESTRICTIONS The Zio ambulatory cardiac monitor is a small, unobtrusive, and water-resistant patch--you might even forget you're wearing it. The Zio monitor records and stores every beat of your heart, whether you're sleeping, working out, or showering.  Wear the monitor for 2 weeks, remove 02/21/21.  Follow-Up: At [facility], you and your health needs are our priority.  As part of our continuing mission to provide you with  exceptional heart care, we have created designated Provider Care Teams.  These Care Teams include your primary Cardiologist (physician) and Advanced Practice Providers (APPs -  Physician Assistants and Nurse Practitioners) who all work together to provide you with the care you need, when you need it.  We recommend signing up for the  patient portal called "MyChart".  Sign up information is provided on this After Visit Summary.  MyChart is used to connect with patients for Virtual Visits (Telemedicine).  Patients are able to view lab/test results, encounter notes, upcoming appointments, etc.  Non-urgent messages can be sent to your provider as well.   To learn more about what you can do with MyChart, go to [email or web address].    Your next appointment:   3 month(s)  The format for your next appointment:   In Person  Provider:   Belva Crome, MD   Other Instructions  Cardiac Nuclear Scan  A cardiac nuclear scan is a test that is done to check the flow of blood to your heart. It is done when you are resting and when you are exercising. The test looks for problems such as:  Not enough blood reaching a portion of the heart.  The heart muscle not working as it should. You may need this test if:  You have heart disease.  You have had lab results that are not normal.  You have had heart surgery or a balloon procedure to open up blocked arteries (angioplasty).  You have chest pain.  You have shortness of breath. In this test, a special dye (tracer) is put into your bloodstream. The tracer will travel to your heart. A camera will then take pictures of your heart to see how the tracer moves through your heart. This test is usually done at a hospital and takes 2-4 hours. Tell a doctor about:  Any allergies you have.  All medicines you are taking, including vitamins, herbs, eye drops, creams, and over-the-counter medicines.  Any problems you or family members have had with anesthetic medicines.  Any blood disorders you have.  Any surgeries you have had.  Any medical conditions you have.  Whether you are pregnant or may be pregnant. What are the risks? Generally, this is a safe test. However, problems may occur, such as:  Serious chest pain and heart attack. This is only a risk if the stress portion of  the test is done.  Rapid heartbeat.  A feeling of warmth in your chest. This feeling usually does not last long.  Allergic reaction to the tracer. What happens before the test?  Ask your doctor about changing or stopping your normal medicines. This is important.  Follow instructions from your doctor about what you cannot eat or drink.  Remove your jewelry on the day of the test. What happens during the test? 1. An IV tube will be inserted into one of your veins. 2. Your doctor will give you a small amount of tracer through the IV tube. 3. You will wait for 20-40 minutes while the tracer moves through your bloodstream. 4. Your heart will be monitored with an electrocardiogram (ECG). 5. You will lie down on an exam table. 6. Pictures of your heart will be taken for about 15-20 minutes. 7. You may also have a stress test. For this test, one of these things may be done: ? You will be asked to exercise on a treadmill or a stationary bike. ? You will be given medicines that will make your heart work  harder. This is done if you are unable to exercise. 8. When blood flow to your heart has peaked, a tracer will again be given through the IV tube. 9. After 20-40 minutes, you will get back on the exam table. More pictures will be taken of your heart. 10. Depending on the tracer that is used, more pictures may need to be taken 3-4 hours later. 11. Your IV tube will be removed when the test is over. The test may vary among doctors and hospitals. What happens after the test? 1. Ask your doctor: ? Whether you can return to your normal schedule, including diet, activities, and medicines. ? Whether you should drink more fluids. This will help to remove the tracer from your body. Drink enough fluid to keep your pee (urine) pale yellow. 2. Ask your doctor, or the department that is doing the test: ? When will my results be ready? ? How will I get my results? Summary  A cardiac nuclear scan is a  test that is done to check the flow of blood to your heart.  Tell your doctor whether you are pregnant or may be pregnant.  Before the test, ask your doctor about changing or stopping your normal medicines. This is important.  Ask your doctor whether you can return to your normal activities. You may be asked to drink more fluids. This information is not intended to replace advice given to you by your health care provider. Make sure you discuss any questions you have with your health care provider. Document Revised: 02/15/2019 Document Reviewed: 04/11/2018 Elsevier Patient Education  [place].  Echocardiogram An echocardiogram is a procedure that uses painless sound waves (ultrasound) to produce an image of the heart. Images from an echocardiogram can provide important information about:  Signs of coronary artery disease (CAD).  Aneurysm detection. An aneurysm is a weak or damaged part of an artery wall that bulges out from the normal force of blood pumping through the body.  Heart size and shape. Changes in the size or shape of the heart can be associated with certain conditions, including heart failure, aneurysm, and CAD.  Heart muscle function.  Heart valve function.  Signs of a past heart attack.  Fluid buildup around the heart.  Thickening of the heart muscle.  A tumor or infectious growth around the heart valves. Tell a health care provider about:  Any allergies you have.  All medicines you are taking, including vitamins, herbs, eye drops, creams, and over-the-counter medicines.  Any blood disorders you have.  Any surgeries you have had.  Any medical conditions you have.  Whether you are pregnant or may be pregnant. What are the risks? Generally, this is a safe procedure. However, problems may occur, including:  Allergic reaction to dye (contrast) that may be used during the procedure. What happens before the procedure? No specific preparation is  needed. You may eat and drink normally. What happens during the procedure?    An IV tube may be inserted into one of your veins.  You may receive contrast through this tube. A contrast is an injection that improves the quality of the pictures from your heart.  A gel will be applied to your chest.  A wand-like tool (transducer) will be moved over your chest. The gel will help to transmit the sound waves from the transducer.  The sound waves will harmlessly bounce off of your heart to allow the heart images to be captured in real-time motion. The images will  be recorded on a computer. The procedure may vary among health care providers and hospitals. What happens after the procedure?  You may return to your normal, everyday life, including diet, activities, and medicines, unless your health care provider tells you not to do that. Summary  An echocardiogram is a procedure that uses painless sound waves (ultrasound) to produce an image of the heart.  Images from an echocardiogram can provide important information about the size and shape of your heart, heart muscle function, heart valve function, and fluid buildup around your heart.  You do not need to do anything to prepare before this procedure. You may eat and drink normally.  After the echocardiogram is completed, you may return to your normal, everyday life, unless your health care provider tells you not to do that. This information is not intended to replace advice given to you by your health care provider. Make sure you discuss any questions you have with your health care provider. Document Revised: 02/16/2019 Document Reviewed: 11/28/2016 Elsevier Patient Education  [place].

## 2021-02-07 NOTE — Progress Notes (Signed)
Cardiology Office Note:    Date:  02/07/2021   ID:  Kathy Villa, DOB 01-15-1961, MRN [id number]  PCP:  Blane Ohara, MD  Cardiologist:  Garwin Brothers, MD   Referring MD: Janie Morning, NP    ASSESSMENT:    1. Essential (primary) hypertension   2. Mixed hyperlipidemia   3. Palpitations   4. Cardiac murmur   5. Obesity (BMI 30.0-34.9)   6. DOE (dyspnea on exertion)   7. Systemic lupus erythematosus, organ or system involvement unspecified (HCC)    PLAN:    In order of problems listed above:  1. Primary prevention stressed with patient.  Importance of compliance with diet medication stressed and she vocalized understanding. 2. Palpitations: I discussed my findings with her.  Her TSH is unremarkable.  She will have a 2-week monitor to assess the symptoms. 3. Essential hypertension: Blood pressure stable and diet was mentioned.  Lifestyle modification urged. 4. Mixed dyslipidemia: Lipids are mildly elevated and I discussed diet with her. 5. Cardiac murmur: Echocardiogram will be done to assess murmur heard on auscultation. 6. Chest pain and dyspnea on exertion: Atypical however in view of risk factors we will do a Lexiscan sestamibi.  She knows to go to the nearest emergency room for any concerning symptoms. 7. Patient will be seen in follow-up appointment in 6 months or earlier if the patient has any concerns    Medication Adjustments/Labs and Tests Ordered: Current medicines are reviewed at length with the patient today.  Concerns regarding medicines are outlined above.  No orders of the defined types were placed in this encounter.  No orders of the defined types were placed in this encounter.    History of Present Illness:    Kathy Villa is a 60 y.o. female who is being seen today for the evaluation of palpitations and dyspnea on exertion at the request of Janie Morning, NP.  Patient is a pleasant 60 year old female.  She is accompanied by her husband.   She has history of essential hypertension and dyslipidemia and systemic lupus erythematosus.  She mentions to me that she has palpitations that has concerning her.  She has chest discomfort at times not related to exertion.  She leads a sedentary lifestyle.  She has mild dyslipidemia.  She is a very remote ex-smoker.  She is concerned about the symptoms and therefore is here for an evaluation.  She leads a sedentary lifestyle.  At the time of my evaluation, the patient is alert awake oriented and in no distress.  Past Medical History:  Diagnosis Date  . Attention deficit hyperactivity disorder, predominantly inattentive type    prefers no medicines.  . Cough 11/07/2020  . Dizziness and giddiness 01/14/2020  . Encounter for immunization 08/28/2020  . Essential (primary) hypertension 2016  . Essential hypertension, benign 08/28/2020  . Fatigue 11/07/2020  . Insomnia 01/14/2020  . Left-sided headache 01/14/2020  . Migraine with aura, intractable, without status migrainosus 1980  . Mixed hyperlipidemia 08/28/2020  . Other specified nontoxic goiter   . Overweight   . Paresthesias 01/14/2020  . SLE (systemic lupus erythematosus related syndrome) (HCC) 01/14/2020  . Systemic lupus erythematosus, organ or system involvement unspecified (HCC) 2006    Past Surgical History:  Procedure Laterality Date  . CESAREAN SECTION      Current Medications: Current Meds  Medication Sig  . amLODipine (NORVASC) 5 MG tablet Take 5 mg by mouth daily.  Marland Kitchen aspirin 81 MG chewable tablet Chew 81 mg  by mouth daily.  . cyclobenzaprine (FLEXERIL) 5 MG tablet Take 1 tablet (5 mg total) by mouth at bedtime as needed for muscle spasms.  . hydroxychloroquine (PLAQUENIL) 200 MG tablet Take 1 tablet (200 mg total) by mouth 2 (two) times daily.  . meloxicam (MOBIC) 15 MG tablet Take 1 tablet (15 mg total) by mouth daily.     Allergies:   Patient has no known allergies.   Social History   Socioeconomic History  . Marital  status: Married    Spouse name: Not on file  . Number of children: 3  . Years of education: Not on file  . Highest education level: Not on file  Occupational History  . Occupation: Energy manager of [employer]  Tobacco Use  . Smoking status: Former Smoker    Types: Cigarettes    Quit date: 11/09/1982    Years since quitting: 38.2  . Smokeless tobacco: Never Used  Vaping Use  . Vaping Use: Never used  Substance and Sexual Activity  . Alcohol use: Not Currently  . Drug use: Never  . Sexual activity: Yes  Other Topics Concern  . Not on file  Social History Narrative   Right handed   Lives with husband   Social Determinants of Health   Financial Resource Strain: Not on file  Food Insecurity: Not on file  Transportation Needs: Not on file  Physical Activity: Not on file  Stress: Not on file  Social Connections: Not on file     Family History: The patient's family history includes Congestive Heart Failure in her father.  ROS:   Please see the history of present illness.    All other systems reviewed and are negative.  EKGs/Labs/Other Studies Reviewed:    The following studies were reviewed today: I discussed my findings with the patient at length.  EKG reveals sinus rhythm and nonspecific ST-T changes   Recent Labs: 01/15/2021: ALT 27; BUN 15; Creatinine, Ser 1.00; Hemoglobin 13.0; Platelets 271; Potassium 4.2; Sodium 142; TSH 2.360  Recent Lipid Panel    Component Value Date/Time   CHOL 174 01/15/2021 0853   TRIG 72 01/15/2021 0853   HDL 53 01/15/2021 0853   CHOLHDL 3.3 01/15/2021 0853   LDLCALC 107 (H) 01/15/2021 0853    Physical Exam:    VS:  BP 136/88   Pulse 72   Ht 5' 6.6" (1.692 m)   Wt 197 lb 12.8 oz (89.7 kg)   SpO2 98%   BMI 31.35 kg/m     Wt Readings from Last 3 Encounters:  02/07/21 197 lb 12.8 oz (89.7 kg)  02/06/21 196 lb 9.6 oz (89.2 kg)  01/15/21 199 lb (90.3 kg)     GEN: Patient is in no acute distress HEENT:  Normal NECK: No JVD; No carotid bruits LYMPHATICS: No lymphadenopathy CARDIAC: S1 S2 regular, 2/6 systolic murmur at the apex. RESPIRATORY:  Clear to auscultation without rales, wheezing or rhonchi  ABDOMEN: Soft, non-tender, non-distended MUSCULOSKELETAL:  No edema; No deformity  SKIN: Warm and dry NEUROLOGIC:  Alert and oriented x 3 PSYCHIATRIC:  Normal affect    Signed, Garwin Brothers, MD  02/07/2021 11:18 AM    [facility]

## 2021-02-10 ENCOUNTER — Telehealth: Payer: Self-pay | Admitting: Cardiology

## 2021-02-10 NOTE — Telephone Encounter (Signed)
Called patient back. She is going to mail in monitor and a new one will be sent to her since she only got to wear for 8 hours. Patient will let us know if she has any further issues.

## 2021-02-10 NOTE — Telephone Encounter (Signed)
Called patient. She only got to wear monitor for 8 hours, had to go to the hospital and it was removed. She was never able to  get it back on without it falling off. Will check with [facility] staff on site to see if they can put a new one on her today.

## 2021-02-10 NOTE — Telephone Encounter (Signed)
Patient states Friday she had her heart monitor put on in the office, but then had to go to the hospital. She states they had to take to it off and could not put it back on.

## 2021-02-28 ENCOUNTER — Other Ambulatory Visit: Payer: Self-pay | Admitting: Family Medicine

## 2021-03-13 ENCOUNTER — Telehealth (INDEPENDENT_AMBULATORY_CARE_PROVIDER_SITE_OTHER): Payer: BC Managed Care – PPO | Admitting: Family Medicine

## 2021-03-13 ENCOUNTER — Encounter: Payer: Self-pay | Admitting: Family Medicine

## 2021-03-13 DIAGNOSIS — M791 Myalgia, unspecified site: Secondary | ICD-10-CM | POA: Diagnosis not present

## 2021-03-13 DIAGNOSIS — J40 Bronchitis, not specified as acute or chronic: Secondary | ICD-10-CM

## 2021-03-13 DIAGNOSIS — U071 COVID-19: Secondary | ICD-10-CM | POA: Diagnosis not present

## 2021-03-13 DIAGNOSIS — G4489 Other headache syndrome: Secondary | ICD-10-CM

## 2021-03-13 LAB — POCT INFLUENZA A/B
Influenza A, POC: NEGATIVE
Influenza B, POC: NEGATIVE

## 2021-03-13 LAB — POC COVID19 BINAXNOW: SARS Coronavirus 2 Ag: POSITIVE — AB

## 2021-03-13 MED ORDER — ALBUTEROL SULFATE HFA 108 (90 BASE) MCG/ACT IN AERS: 2.0000 | INHALATION_SPRAY | Freq: Four times a day (QID) | RESPIRATORY_TRACT | 0 refills | Status: DC | PRN

## 2021-03-13 NOTE — Progress Notes (Signed)
Virtual Visit via Telephone Note   This visit type was conducted due to national recommendations for restrictions regarding the COVID-19 Pandemic (e.g. social distancing) in an effort to limit this patient's exposure and mitigate transmission in our community.  Due to her co-morbid illnesses, this patient is at least at moderate risk for complications without adequate follow up.  This format is felt to be most appropriate for this patient at this time.  The patient did not have access to video technology/had technical difficulties with video requiring transitioning to audio format only (telephone).  All issues noted in this document were discussed and addressed.  No physical exam could be performed with this format.  Patient verbally consented to a telehealth visit.   Date:  03/13/2021   ID:  Kathy Villa, DOB [birth year], MRN [id number]  Patient Location: Home Provider Location: Office/Clinic  PCP:  Blane Ohara, MD   Evaluation Performed: acute Chief Complaint:  cough  History of Present Illness:    Kathy Villa is a 60 y.o. female with AKI, fever, wheezing, congestion, headache, cough since last Saturday.  Temperature is 99.4.  Patient flew to a funeral on Saturday. She felt the worst 1-2 days ago, but is still having a bad cough.   The patient does have symptoms concerning for COVID-19 infection (fever, chills, cough, or new shortness of breath).    Past Medical History:  Diagnosis Date  . Attention deficit hyperactivity disorder, predominantly inattentive type    prefers no medicines.  . Cough 11/07/2020  . Dizziness and giddiness 01/14/2020  . Encounter for immunization 08/28/2020  . Essential (primary) hypertension 2016  . Essential hypertension, benign 08/28/2020  . Fatigue 11/07/2020  . Insomnia 01/14/2020  . Left-sided headache 01/14/2020  . Migraine with aura, intractable, without status migrainosus 1980  . Mixed hyperlipidemia 08/28/2020  . Other specified nontoxic  goiter   . Overweight   . Paresthesias 01/14/2020  . SLE (systemic lupus erythematosus related syndrome) (HCC) 01/14/2020  . Systemic lupus erythematosus, organ or system involvement unspecified (HCC) 2006    Past Surgical History:  Procedure Laterality Date  . CESAREAN SECTION      Family History  Problem Relation Age of Onset  . Congestive Heart Failure Father     Social History   Socioeconomic History  . Marital status: Married    Spouse name: Not on file  . Number of children: 3  . Years of education: Not on file  . Highest education level: Not on file  Occupational History  . Occupation: Energy manager of [employer]  Tobacco Use  . Smoking status: Former Smoker    Types: Cigarettes    Quit date: 11/09/1982    Years since quitting: 38.3  . Smokeless tobacco: Never Used  Vaping Use  . Vaping Use: Never used  Substance and Sexual Activity  . Alcohol use: Not Currently  . Drug use: Never  . Sexual activity: Yes  Other Topics Concern  . Not on file  Social History Narrative   Right handed   Lives with husband   Social Determinants of Health   Financial Resource Strain: Not on file  Food Insecurity: Not on file  Transportation Needs: Not on file  Physical Activity: Not on file  Stress: Not on file  Social Connections: Not on file  Intimate Partner Violence: Not on file    Outpatient Medications Prior to Visit  Medication Sig Dispense Refill  . amLODipine (NORVASC) 5 MG tablet Take 5  mg by mouth daily.    Marland Kitchen aspirin 81 MG chewable tablet Chew 81 mg by mouth daily.    Marland Kitchen atorvastatin (LIPITOR) 40 MG tablet TAKE 1 TABLET BY MOUTH AT BEDTIME 90 tablet 0  . cyclobenzaprine (FLEXERIL) 5 MG tablet Take 1 tablet (5 mg total) by mouth at bedtime as needed for muscle spasms. 30 tablet 2  . hydroxychloroquine (PLAQUENIL) 200 MG tablet Take 1 tablet (200 mg total) by mouth 2 (two) times daily. 180 tablet 0  . meloxicam (MOBIC) 15 MG tablet Take 1 tablet  (15 mg total) by mouth daily. 30 tablet 0   No facility-administered medications prior to visit.    Allergies:   Patient has no known allergies.   Social History   Tobacco Use  . Smoking status: Former Smoker    Types: Cigarettes    Quit date: 11/09/1982    Years since quitting: 38.3  . Smokeless tobacco: Never Used  Vaping Use  . Vaping Use: Never used  Substance Use Topics  . Alcohol use: Not Currently  . Drug use: Never     Review of Systems  Constitutional: Positive for fever and malaise/fatigue.  HENT: Positive for congestion, ear pain and sore throat.   Respiratory: Positive for cough, shortness of breath and wheezing.   Gastrointestinal: Negative for diarrhea and vomiting.  Neurological: Positive for headaches.     Labs/Other Tests and Data Reviewed:    Recent Labs: 01/15/2021: ALT 27; BUN 15; Creatinine, Ser 1.00; Hemoglobin 13.0; Platelets 271; Potassium 4.2; Sodium 142; TSH 2.360   Recent Lipid Panel Lab Results  Component Value Date/Time   CHOL 174 01/15/2021 08:53 AM   TRIG 72 01/15/2021 08:53 AM   HDL 53 01/15/2021 08:53 AM   CHOLHDL 3.3 01/15/2021 08:53 AM   LDLCALC 107 (H) 01/15/2021 08:53 AM    Wt Readings from Last 3 Encounters:  02/07/21 197 lb 12.8 oz (89.7 kg)  02/06/21 196 lb 9.6 oz (89.2 kg)  01/15/21 199 lb (90.3 kg)     Objective:    Vital Signs:  There were no vitals taken for this visit.   Physical Exam Vitals reviewed.  HENT:     Mouth/Throat:     Comments: Hoarseness.  Pulmonary:     Comments: Coughing through out appointment.   Neurological:     Mental Status: She is alert.      ASSESSMENT & PLAN:   1. Bronchitis due to COVID-19 virus 2. Myalgia 3. Other headache syndrome Albuterol inhaler sent.  Mucinex given.  Ibuprofen.   Rest, fluids, fever medicine.  Isolate and may return back to work on Monday 5/9 Meds ordered this encounter  Medications  . albuterol (VENTOLIN HFA) 108 (90 Base) MCG/ACT inhaler    Sig:  Inhale 2 puffs into the lungs every 6 (six) hours as needed for wheezing or shortness of breath.    Dispense:  8 g    Refill:  0    COVID-19 Education: The signs and symptoms of COVID-19 were discussed with the patient and how to seek care for testing (follow up with PCP or arrange E-visit). The importance of social distancing was discussed today.   I spent 10 minutes dedicated to the care of this patient on the date of this encounter.  Follow Up:  In Person prn  Signed,  Blane Ohara, MD  03/13/2021 2:30 PM    [facility]

## 2021-03-19 ENCOUNTER — Other Ambulatory Visit: Payer: Self-pay | Admitting: Family Medicine

## 2021-03-20 ENCOUNTER — Telehealth (HOSPITAL_COMMUNITY): Payer: Self-pay | Admitting: *Deleted

## 2021-03-20 NOTE — Telephone Encounter (Signed)
Patient given detailed instructions per Myocardial Perfusion Study Information Sheet for the test on 03/27/21 at 7:45. Patient notified to arrive 15 minutes early and that it is imperative to arrive on time for appointment to keep from having the test rescheduled.  If you need to cancel or reschedule your appointment, please call the office within 24 hours of your appointment. . Patient verbalized understanding.Kathy Villa

## 2021-03-26 ENCOUNTER — Telehealth: Payer: Self-pay

## 2021-03-26 NOTE — Telephone Encounter (Signed)
Spoke with the patient's son. Detailed instructions given. He ststed that she would be here for her test. Asked to call back with any questions.S.[name] EMTP

## 2021-03-27 ENCOUNTER — Other Ambulatory Visit: Payer: Self-pay

## 2021-03-27 ENCOUNTER — Ambulatory Visit (INDEPENDENT_AMBULATORY_CARE_PROVIDER_SITE_OTHER): Payer: BC Managed Care – PPO

## 2021-03-27 DIAGNOSIS — R0600 Dyspnea, unspecified: Secondary | ICD-10-CM

## 2021-03-27 DIAGNOSIS — R0609 Other forms of dyspnea: Secondary | ICD-10-CM

## 2021-03-27 LAB — MYOCARDIAL PERFUSION IMAGING
LV dias vol: 96 mL (ref 46–106)
LV sys vol: 42 mL
Peak HR: 90 {beats}/min
Rest HR: 60 {beats}/min
SDS: 1
SRS: 1
SSS: 2
TID: 1.04

## 2021-03-27 MED ORDER — TECHNETIUM TC 99M TETROFOSMIN IV KIT
31.8000 | PACK | Freq: Once | INTRAVENOUS | Status: AC | PRN
  Administered 2021-03-27: 31.8 via INTRAVENOUS

## 2021-03-27 MED ORDER — TECHNETIUM TC 99M TETROFOSMIN IV KIT
10.9000 | PACK | Freq: Once | INTRAVENOUS | Status: AC | PRN
  Administered 2021-03-27: 10.9 via INTRAVENOUS

## 2021-03-27 MED ORDER — REGADENOSON 0.4 MG/5ML IV SOLN
0.4000 mg | Freq: Once | INTRAVENOUS | Status: AC
  Administered 2021-03-27: 0.4 mg via INTRAVENOUS

## 2021-04-17 NOTE — Progress Notes (Signed)
Subjective:  Patient ID: Kathy Villa, female    DOB: [birth year]  Age: 60 y.o. MRN: [id number]  Chief Complaint  Patient presents with   Hyperlipidemia   Hypertension    HPI Mixed hyperlipidemia Lipitor 40 mg qd and aspirin 81 mg qd.  Walking 1 1/2 miles per day.  Drinking plenty water.  Eats fairly healthy. Her only vice is gummy bears.   Essential hypertension, benign Amlodipine 5 mg once daily.   SLE: hydroxychoroquine 200 mg one twice a day. Pt saw Dr. Dierdre Forth and he is considering starting her on injectable. ON meloxicam. Uses flexeril sparingly due to somnolence.     Current Outpatient Medications on File Prior to Visit  Medication Sig Dispense Refill   albuterol (VENTOLIN HFA) 108 (90 Base) MCG/ACT inhaler Inhale 2 puffs into the lungs every 6 (six) hours as needed for wheezing or shortness of breath. 8 g 0   amLODipine (NORVASC) 5 MG tablet Take 5 mg by mouth daily.     aspirin 81 MG chewable tablet Chew 81 mg by mouth daily.     cyclobenzaprine (FLEXERIL) 5 MG tablet Take 1 tablet (5 mg total) by mouth at bedtime as needed for muscle spasms. 30 tablet 2   hydroxychloroquine (PLAQUENIL) 200 MG tablet TAKE 1 TABLET(200 MG) BY MOUTH TWICE DAILY 180 tablet 0   meloxicam (MOBIC) 15 MG tablet Take 1 tablet (15 mg total) by mouth daily. 30 tablet 0   No current facility-administered medications on file prior to visit.   Past Medical History:  Diagnosis Date   Attention deficit hyperactivity disorder, predominantly inattentive type    prefers no medicines.   Cough 11/07/2020   Dizziness and giddiness 01/14/2020   Encounter for immunization 08/28/2020   Essential (primary) hypertension 2016   Essential hypertension, benign 08/28/2020   Fatigue 11/07/2020   Insomnia 01/14/2020   Left-sided headache 01/14/2020   Migraine with aura, intractable, without status migrainosus 1980   Mixed hyperlipidemia 08/28/2020   Other specified nontoxic goiter    Overweight     Paresthesias 01/14/2020   SLE (systemic lupus erythematosus related syndrome) (HCC) 01/14/2020   Systemic lupus erythematosus, organ or system involvement unspecified (HCC) 2006   Past Surgical History:  Procedure Laterality Date   CESAREAN SECTION      Family History  Problem Relation Age of Onset   Congestive Heart Failure Father    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  Tobacco Use   Smoking status: Former    Pack years: 0.00    Types: Cigarettes    Quit date: 11/09/1982    Years since quitting: 38.4   Smokeless tobacco: Never  Vaping Use   Vaping Use: Never used  Substance and Sexual Activity   Alcohol use: Not Currently   Drug use: Never   Sexual activity: Yes  Other Topics Concern   Not on file  Social History Narrative   Right handed   Lives with husband   Social Determinants of Health   Financial Resource Strain: Not on file  Food Insecurity: Not on file  Transportation Needs: Not on file  Physical Activity: Not on file  Stress: Not on file  Social Connections: Not on file  Review of Systems  Constitutional:  Negative for chills, fatigue and fever.  HENT:  Negative for congestion, ear pain, rhinorrhea and sore throat.   Respiratory:  Negative for cough and shortness of breath (Sometimes).   Cardiovascular:  Negative for chest pain.  Gastrointestinal:  Negative for abdominal pain, constipation, diarrhea, nausea and vomiting.  Genitourinary:  Negative for dysuria and urgency.  Musculoskeletal:  Positive for back pain (mid line. intermittently.). Negative for myalgias.  Neurological:  Negative for dizziness, weakness (left side. a chronic problem.), light-headedness and headaches.  Psychiatric/Behavioral:  Negative for  dysphoric mood. The patient is not nervous/anxious.     Objective:  BP 120/68   Pulse 88   Temp 97.6 F (36.4 C)   Ht 5\' 6"  (1.676 m)   Wt 197 lb (89.4 kg)   SpO2 100%   BMI 31.80 kg/m   BP/Weight 04/18/2021 02/07/2021 02/06/2021  Systolic BP 120 136 122  Diastolic BP 68 88 78  Wt. (Lbs) 197 197.8 196.6  BMI 31.8 31.35 31.73    Physical Exam Vitals reviewed.  Constitutional:      Appearance: Normal appearance. She is normal weight.  Neck:     Vascular: No carotid bruit.  Cardiovascular:     Rate and Rhythm: Normal rate and regular rhythm.     Pulses: Normal pulses.     Heart sounds: Normal heart sounds.  Pulmonary:     Effort: Pulmonary effort is normal. No respiratory distress.     Breath sounds: Normal breath sounds.  Abdominal:     General: Abdomen is flat. Bowel sounds are normal.     Palpations: Abdomen is soft.     Tenderness: There is no abdominal tenderness.  Musculoskeletal:        General: Tenderness (lumbar) present.  Neurological:     Mental Status: She is alert and oriented to person, place, and time.     Motor: Weakness (left leg.) present.  Psychiatric:        Mood and Affect: Mood normal.        Behavior: Behavior normal.    Diabetic Foot Exam - Simple   No data filed      Lab Results  Component Value Date   WBC 4.8 01/15/2021   HGB 13.0 01/15/2021   HCT 39.6 01/15/2021   PLT 271 01/15/2021   GLUCOSE 83 01/15/2021   CHOL 174 01/15/2021   TRIG 72 01/15/2021   HDL 53 01/15/2021   LDLCALC 107 (H) 01/15/2021   ALT 27 01/15/2021   AST 30 01/15/2021   NA 142 01/15/2021   K 4.2 01/15/2021   CL 104 01/15/2021   CREATININE 1.00 01/15/2021   BUN 15 01/15/2021   CO2 15 (L) 01/15/2021   TSH 2.360 01/15/2021      Assessment & Plan:   1. Mixed hyperlipidemia Well controlled.  No changes to medicines.  Continue to work on eating a healthy diet and exercise.  Labs drawn today.  - Lipid panel - atorvastatin (LIPITOR) 40 MG tablet; Take 1  tablet (40 mg total) by mouth at bedtime.  Dispense: 90 tablet; Refill: 0  2. Essential hypertension, benign Well controlled.  No changes to medicines.  Continue to work on eating a healthy diet and exercise.  Labs drawn today.  - CBC with Differential/Platelet - Comprehensive metabolic panel  3. Lumbar pain - DG Lumbar Spine Complete   4. Lupus - follow up with Dr. 03/17/2021. I will let him prescribe dmards.  5. BMI 31 Recommend  continue to work on eating healthy diet and exercise.   6. Left leg weakness: get mri neck rescheduled.  Meds ordered this encounter  Medications   atorvastatin (LIPITOR) 40 MG tablet    Sig: Take 1 tablet (40 mg total) by mouth at bedtime.    Dispense:  90 tablet    Refill:  0    **Patient requests 90 days supply**     Orders Placed This Encounter  Procedures   DG Lumbar Spine Complete   CBC with Differential/Platelet   Comprehensive metabolic panel   Lipid panel      Follow-up: Return in about 3 months (around 07/19/2021) for fasting.  An After Visit Summary was printed and given to the patient.  Blane Ohara, MD [facility] [phone]

## 2021-04-18 ENCOUNTER — Ambulatory Visit: Payer: BC Managed Care – PPO | Admitting: Family Medicine

## 2021-04-18 ENCOUNTER — Other Ambulatory Visit: Payer: Self-pay

## 2021-04-18 ENCOUNTER — Ambulatory Visit (INDEPENDENT_AMBULATORY_CARE_PROVIDER_SITE_OTHER): Payer: BC Managed Care – PPO

## 2021-04-18 ENCOUNTER — Encounter: Payer: Self-pay | Admitting: Family Medicine

## 2021-04-18 VITALS — BP 120/68 | HR 88 | Temp 97.6°F | Ht 66.0 in | Wt 197.0 lb

## 2021-04-18 DIAGNOSIS — I10 Essential (primary) hypertension: Secondary | ICD-10-CM

## 2021-04-18 DIAGNOSIS — Z6831 Body mass index (BMI) 31.0-31.9, adult: Secondary | ICD-10-CM

## 2021-04-18 DIAGNOSIS — Z23 Encounter for immunization: Secondary | ICD-10-CM

## 2021-04-18 DIAGNOSIS — M545 Low back pain, unspecified: Secondary | ICD-10-CM | POA: Diagnosis not present

## 2021-04-18 DIAGNOSIS — R29898 Other symptoms and signs involving the musculoskeletal system: Secondary | ICD-10-CM

## 2021-04-18 DIAGNOSIS — M3219 Other organ or system involvement in systemic lupus erythematosus: Secondary | ICD-10-CM

## 2021-04-18 DIAGNOSIS — E782 Mixed hyperlipidemia: Secondary | ICD-10-CM | POA: Diagnosis not present

## 2021-04-18 MED ORDER — ATORVASTATIN CALCIUM 40 MG PO TABS: 1.0000 | ORAL_TABLET | Freq: Every day | ORAL | 0 refills | Status: DC

## 2021-04-19 ENCOUNTER — Other Ambulatory Visit: Payer: Self-pay | Admitting: Family Medicine

## 2021-04-19 LAB — COMPREHENSIVE METABOLIC PANEL
ALT: 29 IU/L (ref 0–32)
AST: 23 IU/L (ref 0–40)
Albumin/Globulin Ratio: 1.8 (ref 1.2–2.2)
Albumin: 4.5 g/dL (ref 3.8–4.9)
Alkaline Phosphatase: 84 IU/L (ref 44–121)
BUN/Creatinine Ratio: 13 (ref 12–28)
BUN: 13 mg/dL (ref 8–27)
Bilirubin Total: 0.4 mg/dL (ref 0.0–1.2)
CO2: 25 mmol/L (ref 20–29)
Calcium: 9.3 mg/dL (ref 8.7–10.3)
Chloride: 102 mmol/L (ref 96–106)
Creatinine, Ser: 1 mg/dL (ref 0.57–1.00)
Globulin, Total: 2.5 g/dL (ref 1.5–4.5)
Glucose: 91 mg/dL (ref 65–99)
Potassium: 4.3 mmol/L (ref 3.5–5.2)
Sodium: 141 mmol/L (ref 134–144)
Total Protein: 7 g/dL (ref 6.0–8.5)
eGFR: 64 mL/min/{1.73_m2} (ref >59)

## 2021-04-19 LAB — CBC WITH DIFFERENTIAL/PLATELET
Basophils Absolute: 0 10*3/uL (ref 0.0–0.2)
Basos: 1 %
EOS (ABSOLUTE): 0.1 10*3/uL (ref 0.0–0.4)
Eos: 3 %
Hematocrit: 40.8 % (ref 34.0–46.6)
Hemoglobin: 13.1 g/dL (ref 11.1–15.9)
Immature Grans (Abs): 0 10*3/uL (ref 0.0–0.1)
Immature Granulocytes: 0 %
Lymphocytes Absolute: 1.7 10*3/uL (ref 0.7–3.1)
Lymphs: 44 %
MCH: 30.3 pg (ref 26.6–33.0)
MCHC: 32.1 g/dL (ref 31.5–35.7)
MCV: 94 fL (ref 79–97)
Monocytes Absolute: 0.5 10*3/uL (ref 0.1–0.9)
Monocytes: 12 %
Neutrophils Absolute: 1.5 10*3/uL (ref 1.4–7.0)
Neutrophils: 40 %
Platelets: 251 10*3/uL (ref 150–450)
RBC: 4.33 x10E6/uL (ref 3.77–5.28)
RDW: 12.5 % (ref 11.7–15.4)
WBC: 3.9 10*3/uL (ref 3.4–10.8)

## 2021-04-19 LAB — LIPID PANEL
Chol/HDL Ratio: 3.2 ratio (ref 0.0–4.4)
Cholesterol, Total: 155 mg/dL (ref 100–199)
HDL: 49 mg/dL (ref >39)
LDL Chol Calc (NIH): 95 mg/dL (ref 0–99)
Triglycerides: 55 mg/dL (ref 0–149)
VLDL Cholesterol Cal: 11 mg/dL (ref 5–40)

## 2021-04-19 LAB — CARDIOVASCULAR RISK ASSESSMENT

## 2021-04-21 ENCOUNTER — Other Ambulatory Visit: Payer: Self-pay

## 2021-04-21 ENCOUNTER — Ambulatory Visit (INDEPENDENT_AMBULATORY_CARE_PROVIDER_SITE_OTHER): Payer: BC Managed Care – PPO

## 2021-04-21 DIAGNOSIS — R011 Cardiac murmur, unspecified: Secondary | ICD-10-CM | POA: Diagnosis not present

## 2021-04-21 LAB — ECHOCARDIOGRAM COMPLETE
Area-P 1/2: 5.13 cm2
Calc EF: 44.6 %
S' Lateral: 3 cm
Single Plane A2C EF: 48 %
Single Plane A4C EF: 41.4 %

## 2021-04-21 NOTE — Progress Notes (Signed)
Complete echocardiogram performed.  Jimmy Payal Stanforth RDCS, RVT  

## 2021-04-22 ENCOUNTER — Other Ambulatory Visit: Payer: Self-pay | Admitting: Physician Assistant

## 2021-04-22 DIAGNOSIS — E041 Nontoxic single thyroid nodule: Secondary | ICD-10-CM

## 2021-04-23 ENCOUNTER — Telehealth: Payer: Self-pay

## 2021-04-23 NOTE — Telephone Encounter (Signed)
Pt called requesting refill. In documentation refill for hydroxychloroquine was sent 5/11. Pt stated she was still out and would like Dr. Sedalia Muta to continue this medication and not switch it if possble.   Made pt aware Dr is out of office. Also called pharmacy. Pharmacy states refill sent on 5/11 was never picked up. Pharmacy will fill and pt pick up. Pt made aware. Pt will still like Dr. Sedalia Muta take care of medications.   Lorita Officer, [place] 04/23/21 10:36 AM

## 2021-04-24 ENCOUNTER — Other Ambulatory Visit: Payer: Self-pay | Admitting: Family Medicine

## 2021-04-28 ENCOUNTER — Other Ambulatory Visit: Payer: Self-pay | Admitting: Physician Assistant

## 2021-04-28 DIAGNOSIS — M519 Unspecified thoracic, thoracolumbar and lumbosacral intervertebral disc disorder: Secondary | ICD-10-CM

## 2021-05-06 NOTE — Telephone Encounter (Signed)
Called pt. Pt VU.   Terrill Mohr 05/06/21 8:43 AM

## 2021-06-09 ENCOUNTER — Encounter: Payer: Self-pay | Admitting: Family Medicine

## 2021-06-10 NOTE — Telephone Encounter (Signed)
Printed results. Provider read. Results: benign.   Attempted to call pt. No answer, left detailed VM of results on identifying VM box. Ok'd per FYI. Will also send mychart message.   Lorita Officer, CCMA 06/10/21 8:11 AM

## 2021-06-27 ENCOUNTER — Encounter: Payer: Self-pay | Admitting: Family Medicine

## 2021-07-18 ENCOUNTER — Other Ambulatory Visit: Payer: Self-pay | Admitting: Family Medicine

## 2021-07-18 DIAGNOSIS — E782 Mixed hyperlipidemia: Secondary | ICD-10-CM

## 2021-07-18 NOTE — Telephone Encounter (Signed)
Refill sent to pharmacy.   

## 2021-09-25 ENCOUNTER — Ambulatory Visit: Payer: BC Managed Care – PPO

## 2021-09-25 ENCOUNTER — Other Ambulatory Visit: Payer: Self-pay

## 2021-09-25 ENCOUNTER — Ambulatory Visit (INDEPENDENT_AMBULATORY_CARE_PROVIDER_SITE_OTHER): Payer: BC Managed Care – PPO

## 2021-09-25 DIAGNOSIS — Z23 Encounter for immunization: Secondary | ICD-10-CM | POA: Diagnosis not present

## 2021-10-25 ENCOUNTER — Other Ambulatory Visit: Payer: Self-pay | Admitting: Family Medicine

## 2021-11-10 NOTE — Progress Notes (Signed)
Acute Office Visit  Subjective:    Patient ID: Kathy Villa, female    DOB: [birth year], 61 y.o.   MRN: [id number]  Chief Complaint  Patient presents with   Back Pain   Urinary Frequency    HPI Patient is in today for left back pain for 2 weeks. No dysuria. Drinking water and taking tylenol has helped. Polyuria. No fevers or chills. No injury. No abdominal pain. Some nausea.   Rt great toenail hurts, but is doing better.   Past Medical History:  Diagnosis Date   Attention deficit hyperactivity disorder, predominantly inattentive type    prefers no medicines.   Cough 11/07/2020   Dizziness and giddiness 01/14/2020   Encounter for immunization 08/28/2020   Essential (primary) hypertension 2016   Essential hypertension, benign 08/28/2020   Fatigue 11/07/2020   Insomnia 01/14/2020   Left-sided headache 01/14/2020   Migraine with aura, intractable, without status migrainosus 1980   Mixed hyperlipidemia 08/28/2020   Other specified nontoxic goiter    Overweight    Paresthesias 01/14/2020   SLE (systemic lupus erythematosus related syndrome) ([place]) 01/14/2020   Systemic lupus erythematosus, organ or system involvement unspecified ([place]) 2006    Past Surgical History:  Procedure Laterality Date   CESAREAN SECTION      Family History  Problem Relation Age of Onset   Congestive Heart Failure Father     Social History   Socioeconomic History   Marital status: Married    Spouse name: Not on file   Number of children: 3   Years of education: Not on file   Highest education level: Not on file  Occupational History   Occupation: Scientist, physiological- [employer]  Tobacco Use   Smoking status: Former    Types: Cigarettes    Quit date: 11/09/1982    Years since quitting: 39.0   Smokeless tobacco: Never  Vaping Use   Vaping Use: Never used  Substance and Sexual Activity   Alcohol use: Not Currently   Drug use: Never   Sexual activity: Yes  Other Topics  Concern   Not on file  Social History Narrative   Right handed   Lives with husband   Social Determinants of Health   Financial Resource Strain: Not on file  Food Insecurity: Not on file  Transportation Needs: Not on file  Physical Activity: Not on file  Stress: Not on file  Social Connections: Not on file  Intimate Partner Violence: Not on file    Outpatient Medications Prior to Visit  Medication Sig Dispense Refill   albuterol (VENTOLIN HFA) 108 (90 Base) MCG/ACT inhaler Inhale 2 puffs into the lungs every 6 (six) hours as needed for wheezing or shortness of breath. 8 g 0   amLODipine (NORVASC) 5 MG tablet TAKE 1 TABLET(5 MG) BY MOUTH DAILY 90 tablet 1   aspirin 81 MG chewable tablet Chew 81 mg by mouth daily.     atorvastatin (LIPITOR) 40 MG tablet TAKE 1 TABLET(40 MG) BY MOUTH AT BEDTIME 90 tablet 0   cyclobenzaprine (FLEXERIL) 5 MG tablet Take 1 tablet (5 mg total) by mouth at bedtime as needed for muscle spasms. 30 tablet 2   hydroxychloroquine (PLAQUENIL) 200 MG tablet TAKE 1 TABLET BY MOUTH TWICE DAILY 60 tablet 6   meloxicam (MOBIC) 15 MG tablet Take 1 tablet (15 mg total) by mouth daily. 30 tablet 0   No facility-administered medications prior to visit.    No Known Allergies  Review of  Systems  Constitutional:  Positive for fatigue. Negative for appetite change and fever.  HENT:  Negative for congestion, ear pain, sinus pressure and sore throat.   Eyes:  Negative for pain.  Respiratory:  Negative for cough, chest tightness, shortness of breath and wheezing.   Cardiovascular:  Negative for chest pain and palpitations.  Gastrointestinal:  Negative for abdominal pain, constipation, diarrhea and vomiting.  Genitourinary:  Positive for frequency. Negative for dysuria and hematuria.  Musculoskeletal:  Positive for back pain.  Skin:  Negative for rash.       Fungus right foot  Neurological:  Positive for headaches. Negative for dizziness and weakness.   Psychiatric/Behavioral:  Negative for dysphoric mood. The patient is not nervous/anxious.       Objective:    Physical Exam Vitals reviewed.  Constitutional:      Appearance: Normal appearance. She is normal weight.  Cardiovascular:     Rate and Rhythm: Normal rate and regular rhythm.     Heart sounds: Normal heart sounds.  Pulmonary:     Effort: Pulmonary effort is normal. No respiratory distress.     Breath sounds: Normal breath sounds.  Abdominal:     General: Abdomen is flat. Bowel sounds are normal.     Palpations: Abdomen is soft.     Tenderness: There is no abdominal tenderness.  Musculoskeletal:        General: Tenderness (left flank) present.  Skin:    Comments: Fungus rt toenail, erythema around cuticle.  Neurological:     Mental Status: She is alert and oriented to person, place, and time.  Psychiatric:        Mood and Affect: Mood normal.        Behavior: Behavior normal.    BP 116/78    Pulse 72    Temp 98.2 F (36.8 C)    Resp 18    Wt 198 lb 3.2 oz (89.9 kg)    BMI 31.99 kg/m  Wt Readings from Last 3 Encounters:  11/11/21 198 lb 3.2 oz (89.9 kg)  04/18/21 197 lb (89.4 kg)  03/27/21 197 lb (89.4 kg)    Health Maintenance Due  Topic Date Due   HIV Screening  Never done   Hepatitis C Screening  Never done   TETANUS/TDAP  Never done   Zoster Vaccines- Shingrix (1 of 2) Never done   PAP SMEAR-Modifier  06/09/2014   COVID-19 Vaccine (5 - Booster for Pfizer series) 06/13/2021    There are no preventive care reminders to display for this patient.   Lab Results  Component Value Date   TSH 2.360 01/15/2021   Lab Results  Component Value Date   WBC 3.9 04/18/2021   HGB 13.1 04/18/2021   HCT 40.8 04/18/2021   MCV 94 04/18/2021   PLT 251 04/18/2021   Lab Results  Component Value Date   NA 141 04/18/2021   K 4.3 04/18/2021   CO2 25 04/18/2021   GLUCOSE 91 04/18/2021   BUN 13 04/18/2021   CREATININE 1.00 04/18/2021   BILITOT 0.4 04/18/2021    ALKPHOS 84 04/18/2021   AST 23 04/18/2021   ALT 29 04/18/2021   PROT 7.0 04/18/2021   ALBUMIN 4.5 04/18/2021   CALCIUM 9.3 04/18/2021   EGFR 64 04/18/2021   Lab Results  Component Value Date   CHOL 155 04/18/2021   Lab Results  Component Value Date   HDL 49 04/18/2021   Lab Results  Component Value Date   LDLCALC 95 04/18/2021  Lab Results  Component Value Date   TRIG 55 04/18/2021   Lab Results  Component Value Date   CHOLHDL 3.2 04/18/2021   No results found for: HGBA1C       Assessment & Plan:   Problem List Items Addressed This Visit       Musculoskeletal and Integument   Onychomycosis    Prefers not treatment.  Possible cellulitis can be treated with bactrim ds.      Relevant Medications   sulfamethoxazole-trimethoprim (BACTRIM DS) 800-160 MG tablet     Genitourinary   Acute hemorrhagic cystitis - Primary    Ordered UA and Urine culture. Started Bactrim DS 800-160 mg 1 tablet twice a day x 7 days.        Meds ordered this encounter  Medications   sulfamethoxazole-trimethoprim (BACTRIM DS) 800-160 MG tablet    Sig: Take 1 tablet by mouth 2 (two) times daily.    Dispense:  14 tablet    Refill:  0     Follow up: prn.  Rochel Brome, MD

## 2021-11-11 ENCOUNTER — Ambulatory Visit: Payer: BC Managed Care – PPO | Admitting: Family Medicine

## 2021-11-11 ENCOUNTER — Other Ambulatory Visit: Payer: Self-pay

## 2021-11-11 VITALS — BP 116/78 | HR 72 | Temp 98.2°F | Resp 18 | Wt 198.2 lb

## 2021-11-11 DIAGNOSIS — B351 Tinea unguium: Secondary | ICD-10-CM | POA: Diagnosis not present

## 2021-11-11 DIAGNOSIS — N3001 Acute cystitis with hematuria: Secondary | ICD-10-CM | POA: Diagnosis not present

## 2021-11-11 LAB — POCT URINALYSIS DIPSTICK
Bilirubin, UA: NEGATIVE
Glucose, UA: NEGATIVE
Ketones, UA: NEGATIVE
Nitrite, UA: NEGATIVE
Protein, UA: POSITIVE — AB
Spec Grav, UA: <=1.005 — AB (ref 1.010–1.025)
Urobilinogen, UA: 0.2 E.U./dL
pH, UA: 6.5 (ref 5.0–8.0)

## 2021-11-11 MED ORDER — SULFAMETHOXAZOLE-TRIMETHOPRIM 800-160 MG PO TABS: 1.0000 | ORAL_TABLET | Freq: Two times a day (BID) | ORAL | 0 refills | Status: DC

## 2021-11-12 LAB — URINE CULTURE

## 2021-11-18 DIAGNOSIS — N3000 Acute cystitis without hematuria: Secondary | ICD-10-CM | POA: Insufficient documentation

## 2021-11-18 DIAGNOSIS — N3001 Acute cystitis with hematuria: Secondary | ICD-10-CM | POA: Insufficient documentation

## 2021-11-18 NOTE — Assessment & Plan Note (Signed)
Ordered UA and Urine culture. Started Bactrim DS 800-160 mg 1 tablet twice a day x 7 days.

## 2021-11-23 ENCOUNTER — Encounter: Payer: Self-pay | Admitting: Family Medicine

## 2021-11-23 DIAGNOSIS — B351 Tinea unguium: Secondary | ICD-10-CM

## 2021-11-23 HISTORY — DX: Tinea unguium: B35.1

## 2021-11-23 NOTE — Assessment & Plan Note (Signed)
Prefers not treatment.  Possible cellulitis can be treated with bactrim ds.

## 2021-11-26 ENCOUNTER — Telehealth: Payer: Self-pay

## 2021-11-26 NOTE — Telephone Encounter (Signed)
Left detailed message on identifying VM box. Recommendations stated on message left.   Lorita Officer, CCMA 11/26/21 1:05 PM

## 2021-11-26 NOTE — Telephone Encounter (Signed)
Patient was seen 11/11/20 for a UTI and was treated with Bactrim BID #[id number].  She called this morning stating that she is still having symptoms (pain and frequency).  Please advise.

## 2021-11-27 ENCOUNTER — Other Ambulatory Visit: Payer: Self-pay

## 2021-11-27 ENCOUNTER — Ambulatory Visit: Payer: BC Managed Care – PPO | Admitting: Family Medicine

## 2021-11-27 VITALS — BP 124/84 | HR 84 | Temp 97.8°F | Resp 18 | Ht 66.5 in | Wt 199.0 lb

## 2021-11-27 DIAGNOSIS — N3001 Acute cystitis with hematuria: Secondary | ICD-10-CM

## 2021-11-27 LAB — POCT URINALYSIS DIPSTICK
Bilirubin, UA: NEGATIVE
Glucose, UA: NEGATIVE
Ketones, UA: NEGATIVE
Nitrite, UA: NEGATIVE
Protein, UA: POSITIVE — AB
Spec Grav, UA: 1.025 (ref 1.010–1.025)
Urobilinogen, UA: 0.2 E.U./dL
pH, UA: 6 (ref 5.0–8.0)

## 2021-11-27 MED ORDER — SULFAMETHOXAZOLE-TRIMETHOPRIM 800-160 MG PO TABS: 1.0000 | ORAL_TABLET | Freq: Two times a day (BID) | ORAL | 0 refills | Status: DC

## 2021-11-27 NOTE — Telephone Encounter (Signed)
Attempted to call pt. No answer, left VM for return call.   Kathy Villa, [place] 11/27/21 9:58 AM

## 2021-11-27 NOTE — Progress Notes (Signed)
Acute Office Visit  Subjective:    Patient ID: Kathy Villa, female    DOB: [birth year], 61 y.o.   MRN: [id number]  Chief Complaint  Patient presents with   Urinary Tract Infection    HPI: Patient is in today for complaints of back pain and urinary frequency.  Symptoms started on Sunday.    Past Medical History:  Diagnosis Date   Attention deficit hyperactivity disorder, predominantly inattentive type    prefers no medicines.   Cough 11/07/2020   Dizziness and giddiness 01/14/2020   Encounter for immunization 08/28/2020   Essential (primary) hypertension 2016   Essential hypertension, benign 08/28/2020   Fatigue 11/07/2020   Insomnia 01/14/2020   Left-sided headache 01/14/2020   Migraine with aura, intractable, without status migrainosus 1980   Mixed hyperlipidemia 08/28/2020   Other specified nontoxic goiter    Overweight    Paresthesias 01/14/2020   SLE (systemic lupus erythematosus related syndrome) ([place]) 01/14/2020   Systemic lupus erythematosus, organ or system involvement unspecified ([place]) 2006    Past Surgical History:  Procedure Laterality Date   CESAREAN SECTION      Family History  Problem Relation Age of Onset   Congestive Heart Failure Father     Social History   Socioeconomic History   Marital status: Married    Spouse name: Not on file   Number of children: 3   Years of education: Not on file   Highest education level: Not on file  Occupational History   Occupation: Scientist, physiological- [employer]  Tobacco Use   Smoking status: Former    Types: Cigarettes    Quit date: 11/09/1982    Years since quitting: 39.0   Smokeless tobacco: Never  Vaping Use   Vaping Use: Never used  Substance and Sexual Activity   Alcohol use: Not Currently   Drug use: Never   Sexual activity: Yes  Other Topics Concern   Not on file  Social History Narrative   Right handed   Lives with husband   Social Determinants of Health   Financial  Resource Strain: Not on file  Food Insecurity: Not on file  Transportation Needs: Not on file  Physical Activity: Not on file  Stress: Not on file  Social Connections: Not on file  Intimate Partner Violence: Not on file    Outpatient Medications Prior to Visit  Medication Sig Dispense Refill   albuterol (VENTOLIN HFA) 108 (90 Base) MCG/ACT inhaler Inhale 2 puffs into the lungs every 6 (six) hours as needed for wheezing or shortness of breath. 8 g 0   amLODipine (NORVASC) 5 MG tablet TAKE 1 TABLET(5 MG) BY MOUTH DAILY 90 tablet 1   aspirin 81 MG chewable tablet Chew 81 mg by mouth daily.     atorvastatin (LIPITOR) 40 MG tablet TAKE 1 TABLET(40 MG) BY MOUTH AT BEDTIME 90 tablet 0   cyclobenzaprine (FLEXERIL) 5 MG tablet Take 1 tablet (5 mg total) by mouth at bedtime as needed for muscle spasms. 30 tablet 2   hydroxychloroquine (PLAQUENIL) 200 MG tablet TAKE 1 TABLET BY MOUTH TWICE DAILY 60 tablet 6   meloxicam (MOBIC) 15 MG tablet Take 1 tablet (15 mg total) by mouth daily. 30 tablet 0   sulfamethoxazole-trimethoprim (BACTRIM DS) 800-160 MG tablet Take 1 tablet by mouth 2 (two) times daily. 14 tablet 0   No facility-administered medications prior to visit.    No Known Allergies  Review of Systems  Constitutional:  Positive for fatigue.  Negative for chills and fever.  Gastrointestinal:  Positive for nausea.  Genitourinary:  Positive for frequency. Negative for dysuria.  Musculoskeletal:  Positive for back pain.      Objective:    Physical Exam Vitals reviewed.  Constitutional:      Appearance: Normal appearance.  Cardiovascular:     Rate and Rhythm: Normal rate and regular rhythm.     Heart sounds: Normal heart sounds.  Pulmonary:     Effort: Pulmonary effort is normal.     Breath sounds: Normal breath sounds.  Abdominal:     General: Bowel sounds are normal.     Palpations: Abdomen is soft.     Comments: Bl LOWER BACK TENDERNESS  Neurological:     Mental Status: She is  alert.    BP 124/84    Pulse 84    Temp 97.8 F (36.6 C)    Resp 18    Ht 5' 6.5" (1.689 m)    Wt 199 lb (90.3 kg)    BMI 31.64 kg/m  Wt Readings from Last 3 Encounters:  11/27/21 199 lb (90.3 kg)  11/11/21 198 lb 3.2 oz (89.9 kg)  04/18/21 197 lb (89.4 kg)    Health Maintenance Due  Topic Date Due   HIV Screening  Never done   Hepatitis C Screening  Never done   TETANUS/TDAP  Never done   Zoster Vaccines- Shingrix (1 of 2) Never done   PAP SMEAR-Modifier  06/09/2014   COVID-19 Vaccine (5 - Booster for Pfizer series) 06/13/2021    There are no preventive care reminders to display for this patient.   Lab Results  Component Value Date   TSH 2.360 01/15/2021   Lab Results  Component Value Date   WBC 3.9 04/18/2021   HGB 13.1 04/18/2021   HCT 40.8 04/18/2021   MCV 94 04/18/2021   PLT 251 04/18/2021   Lab Results  Component Value Date   NA 141 04/18/2021   K 4.3 04/18/2021   CO2 25 04/18/2021   GLUCOSE 91 04/18/2021   BUN 13 04/18/2021   CREATININE 1.00 04/18/2021   BILITOT 0.4 04/18/2021   ALKPHOS 84 04/18/2021   AST 23 04/18/2021   ALT 29 04/18/2021   PROT 7.0 04/18/2021   ALBUMIN 4.5 04/18/2021   CALCIUM 9.3 04/18/2021   EGFR 64 04/18/2021   Lab Results  Component Value Date   CHOL 155 04/18/2021   Lab Results  Component Value Date   HDL 49 04/18/2021   Lab Results  Component Value Date   LDLCALC 95 04/18/2021   Lab Results  Component Value Date   TRIG 55 04/18/2021   Lab Results  Component Value Date   CHOLHDL 3.2 04/18/2021   No results found for: HGBA1C     Assessment & Plan:   Problem List Items Addressed This Visit       Genitourinary   Acute hemorrhagic cystitis - Primary    rx bactrim ds Ua/cx sent.       Meds ordered this encounter  Medications   sulfamethoxazole-trimethoprim (BACTRIM DS) 800-160 MG tablet    Sig: Take 1 tablet by mouth 2 (two) times daily.    Dispense:  14 tablet    Refill:  0    Orders Placed  This Encounter  Procedures   Urine Culture   POCT urinalysis dipstick     Follow-up: Return if symptoms worsen or fail to improve, for Nurse visit for ua and cx if needed. Marland Kitchen  An  After Visit Summary was printed and given to the patient.  Rochel Brome, MD [facility] [phone]

## 2021-11-28 LAB — URINE CULTURE

## 2021-12-01 NOTE — Telephone Encounter (Signed)
Pt returned on 1/19.   Lorita Officer, [place] 12/01/21 11:43 AM

## 2021-12-01 NOTE — Assessment & Plan Note (Signed)
rx bactrim ds Ua/cx sent.

## 2021-12-29 ENCOUNTER — Other Ambulatory Visit: Payer: Self-pay

## 2021-12-29 ENCOUNTER — Ambulatory Visit: Payer: BC Managed Care – PPO | Admitting: Family Medicine

## 2021-12-29 ENCOUNTER — Encounter: Payer: Self-pay | Admitting: Family Medicine

## 2021-12-29 VITALS — BP 140/84 | HR 70 | Temp 98.1°F | Ht 66.5 in | Wt 198.0 lb

## 2021-12-29 DIAGNOSIS — N3000 Acute cystitis without hematuria: Secondary | ICD-10-CM

## 2021-12-29 DIAGNOSIS — R350 Frequency of micturition: Secondary | ICD-10-CM

## 2021-12-29 LAB — POCT URINALYSIS DIPSTICK
Bilirubin, UA: NEGATIVE
Blood, UA: NEGATIVE
Glucose, UA: NEGATIVE
Ketones, UA: NEGATIVE
Nitrite, UA: NEGATIVE
Protein, UA: NEGATIVE
Spec Grav, UA: 1.02 (ref 1.010–1.025)
Urobilinogen, UA: NEGATIVE E.U./dL — AB
pH, UA: 6 (ref 5.0–8.0)

## 2021-12-29 MED ORDER — CIPROFLOXACIN HCL 500 MG PO TABS: 500.0000 mg | ORAL_TABLET | Freq: Two times a day (BID) | ORAL | 0 refills | Status: AC

## 2021-12-29 MED ORDER — OMEPRAZOLE 40 MG PO CPDR: 40.0000 mg | DELAYED_RELEASE_CAPSULE | Freq: Every day | ORAL | 3 refills | Status: DC

## 2021-12-29 NOTE — Assessment & Plan Note (Addendum)
Sent cipro 500 mg twice daily  7 days.  Sent culture.  If culture comes back with urogenital flora, I would recommend urology referral.

## 2021-12-29 NOTE — Progress Notes (Signed)
Acute Office Visit  Subjective:    Patient ID: Kathy Villa, female    DOB: 07/25/1961, 61 y.o.   MRN: [id number]  Chief Complaint  Patient presents with   Urinary Tract Infection   HPI: Patient is in today for increased frequency, increased volume of urine, nocturia. Came in with similar symptoms x 2 in January 2023. Both urine cultures were contaminated. Her symptoms resolved anyway with antibiotic (bactrim ds.) Has some left flank discomfort and lumbar discomfort. No fevers.  Past Medical History:  Diagnosis Date   Attention deficit hyperactivity disorder, predominantly inattentive type    prefers no medicines.   Cough 11/07/2020   Dizziness and giddiness 01/14/2020   Encounter for immunization 08/28/2020   Essential (primary) hypertension 2016   Essential hypertension, benign 08/28/2020   Fatigue 11/07/2020   Insomnia 01/14/2020   Left-sided headache 01/14/2020   Migraine with aura, intractable, without status migrainosus 1980   Mixed hyperlipidemia 08/28/2020   Other specified nontoxic goiter    Overweight    Paresthesias 01/14/2020   SLE (systemic lupus erythematosus related syndrome) ([place]) 01/14/2020   Systemic lupus erythematosus, organ or system involvement unspecified ([place]) 2006    Past Surgical History:  Procedure Laterality Date   CESAREAN SECTION      Family History  Problem Relation Age of Onset   Congestive Heart Failure Father     Social History   Socioeconomic History   Marital status: Married    Spouse name: Not on file   Number of children: 3   Years of education: Not on file   Highest education level: Not on file  Occupational History   Occupation: Scientist, physiological- [employer]  Tobacco Use   Smoking status: Former    Types: Cigarettes    Quit date: 11/09/1982    Years since quitting: 39.1   Smokeless tobacco: Never  Vaping Use   Vaping Use: Never used  Substance and Sexual Activity   Alcohol use: Not Currently   Drug  use: Never   Sexual activity: Yes  Other Topics Concern   Not on file  Social History Narrative   Right handed   Lives with husband   Social Determinants of Health   Financial Resource Strain: Not on file  Food Insecurity: Not on file  Transportation Needs: Not on file  Physical Activity: Not on file  Stress: Not on file  Social Connections: Not on file  Intimate Partner Violence: Not on file    Outpatient Medications Prior to Visit  Medication Sig Dispense Refill   albuterol (VENTOLIN HFA) 108 (90 Base) MCG/ACT inhaler Inhale 2 puffs into the lungs every 6 (six) hours as needed for wheezing or shortness of breath. 8 g 0   amLODipine (NORVASC) 5 MG tablet TAKE 1 TABLET(5 MG) BY MOUTH DAILY 90 tablet 1   aspirin 81 MG chewable tablet Chew 81 mg by mouth daily.     atorvastatin (LIPITOR) 40 MG tablet TAKE 1 TABLET(40 MG) BY MOUTH AT BEDTIME 90 tablet 0   cyclobenzaprine (FLEXERIL) 5 MG tablet Take 1 tablet (5 mg total) by mouth at bedtime as needed for muscle spasms. 30 tablet 2   hydroxychloroquine (PLAQUENIL) 200 MG tablet TAKE 1 TABLET BY MOUTH TWICE DAILY 60 tablet 6   meloxicam (MOBIC) 15 MG tablet Take 1 tablet (15 mg total) by mouth daily. 30 tablet 0   sulfamethoxazole-trimethoprim (BACTRIM DS) 800-160 MG tablet Take 1 tablet by mouth 2 (two) times daily. 14 tablet  0   No facility-administered medications prior to visit.    No Known Allergies  Review of Systems  Constitutional:  Negative for appetite change, fatigue and fever.  HENT:  Negative for congestion, ear pain, sinus pressure and sore throat.   Eyes:  Negative for pain.  Respiratory:  Negative for cough, chest tightness, shortness of breath and wheezing.   Cardiovascular:  Negative for chest pain and palpitations.  Gastrointestinal:  Positive for abdominal pain (Lower). Negative for constipation, diarrhea, nausea and vomiting.  Genitourinary:  Positive for dysuria, frequency and urgency. Negative for hematuria.   Musculoskeletal:  Negative for arthralgias, back pain, joint swelling and myalgias.  Skin:  Negative for rash.  Neurological:  Negative for dizziness, weakness and headaches.  Psychiatric/Behavioral:  Negative for dysphoric mood. The patient is not nervous/anxious.       Objective:    Physical Exam Vitals reviewed.  Constitutional:      Appearance: Normal appearance. She is normal weight.  Neck:     Vascular: No carotid bruit.  Cardiovascular:     Rate and Rhythm: Normal rate and regular rhythm.     Heart sounds: Normal heart sounds.  Pulmonary:     Effort: Pulmonary effort is normal. No respiratory distress.     Breath sounds: Normal breath sounds.  Abdominal:     General: Abdomen is flat. Bowel sounds are normal.     Palpations: Abdomen is soft.     Tenderness: There is no abdominal tenderness. There is no right CVA tenderness or left CVA tenderness.  Neurological:     Mental Status: She is alert and oriented to person, place, and time.  Psychiatric:        Mood and Affect: Mood normal.        Behavior: Behavior normal.    There were no vitals taken for this visit. Wt Readings from Last 3 Encounters:  11/27/21 199 lb (90.3 kg)  11/11/21 198 lb 3.2 oz (89.9 kg)  04/18/21 197 lb (89.4 kg)    Health Maintenance Due  Topic Date Due   HIV Screening  Never done   Hepatitis C Screening  Never done   TETANUS/TDAP  Never done   Zoster Vaccines- Shingrix (1 of 2) Never done   PAP SMEAR-Modifier  06/09/2014   COVID-19 Vaccine (5 - Booster for Pfizer series) 06/13/2021    There are no preventive care reminders to display for this patient.   Lab Results  Component Value Date   TSH 2.360 01/15/2021   Lab Results  Component Value Date   WBC 3.9 04/18/2021   HGB 13.1 04/18/2021   HCT 40.8 04/18/2021   MCV 94 04/18/2021   PLT 251 04/18/2021   Lab Results  Component Value Date   NA 141 04/18/2021   K 4.3 04/18/2021   CO2 25 04/18/2021   GLUCOSE 91 04/18/2021    BUN 13 04/18/2021   CREATININE 1.00 04/18/2021   BILITOT 0.4 04/18/2021   ALKPHOS 84 04/18/2021   AST 23 04/18/2021   ALT 29 04/18/2021   PROT 7.0 04/18/2021   ALBUMIN 4.5 04/18/2021   CALCIUM 9.3 04/18/2021   EGFR 64 04/18/2021   Lab Results  Component Value Date   CHOL 155 04/18/2021   Lab Results  Component Value Date   HDL 49 04/18/2021   Lab Results  Component Value Date   LDLCALC 95 04/18/2021   Lab Results  Component Value Date   TRIG 55 04/18/2021   Lab Results  Component Value  Date   CHOLHDL 3.2 04/18/2021   No results found for: HGBA1C       Assessment & Plan:   Problem List Items Addressed This Visit       Genitourinary   Acute cystitis without hematuria - Primary    Sent cipro 500 mg twice daily  7 days.  Sent culture.  If culture comes back with urogenital flora, I would recommend urology referral.      Relevant Medications   ciprofloxacin (CIPRO) 500 MG tablet   Other Relevant Orders   POCT urinalysis dipstick (Completed)   Urinalysis, microscopic only   Urine Culture (Completed)      I,Lauren M Auman,acting as a scribe for Rochel Brome, MD.,have documented all relevant documentation on the behalf of Rochel Brome, MD,as directed by  Rochel Brome, MD while in the presence of Rochel Brome, MD.   Follow up: as needed  Rochel Brome, MD

## 2021-12-31 LAB — URINE CULTURE

## 2022-01-02 ENCOUNTER — Other Ambulatory Visit: Payer: Self-pay

## 2022-01-02 DIAGNOSIS — R350 Frequency of micturition: Secondary | ICD-10-CM

## 2022-01-27 ENCOUNTER — Other Ambulatory Visit: Payer: Self-pay | Admitting: Family Medicine

## 2022-02-03 ENCOUNTER — Other Ambulatory Visit: Payer: Self-pay | Admitting: Family Medicine

## 2022-02-03 DIAGNOSIS — R3129 Other microscopic hematuria: Secondary | ICD-10-CM

## 2022-02-17 ENCOUNTER — Other Ambulatory Visit: Payer: Self-pay | Admitting: Family Medicine

## 2022-02-17 ENCOUNTER — Ambulatory Visit
Admission: RE | Admit: 2022-02-17 | Discharge: 2022-02-17 | Disposition: A | Discharge status: Home / Self Care | Payer: BC Managed Care – PPO | Source: Ambulatory Visit | Attending: Family Medicine | Admitting: Family Medicine

## 2022-02-17 ENCOUNTER — Other Ambulatory Visit: Payer: BC Managed Care – PPO

## 2022-02-17 DIAGNOSIS — R3129 Other microscopic hematuria: Secondary | ICD-10-CM

## 2022-02-17 MED ORDER — IOPAMIDOL (ISOVUE-300) INJECTION 61%
100.0000 mL | Freq: Once | INTRAVENOUS | Status: AC | PRN
  Administered 2022-02-17: 100 mL via INTRAVENOUS

## 2022-03-06 ENCOUNTER — Other Ambulatory Visit: Payer: BC Managed Care – PPO

## 2022-04-08 ENCOUNTER — Other Ambulatory Visit: Payer: Self-pay | Admitting: Legal Medicine

## 2022-04-16 ENCOUNTER — Encounter: Payer: Self-pay | Admitting: Family Medicine

## 2022-04-16 ENCOUNTER — Ambulatory Visit: Payer: BC Managed Care – PPO | Admitting: Family Medicine

## 2022-04-16 VITALS — BP 128/78 | HR 78 | Temp 97.3°F | Ht 66.5 in | Wt 194.0 lb

## 2022-04-16 DIAGNOSIS — E669 Obesity, unspecified: Secondary | ICD-10-CM | POA: Diagnosis not present

## 2022-04-16 DIAGNOSIS — I10 Essential (primary) hypertension: Secondary | ICD-10-CM

## 2022-04-16 DIAGNOSIS — M329 Systemic lupus erythematosus, unspecified: Secondary | ICD-10-CM | POA: Diagnosis not present

## 2022-04-16 DIAGNOSIS — E782 Mixed hyperlipidemia: Secondary | ICD-10-CM

## 2022-04-16 NOTE — Progress Notes (Unsigned)
Subjective:  Patient ID: Kathy Villa, female    DOB: April 12, 1961  Age: 61 y.o. MRN: [id number]  Chief Complaint  Patient presents with   Hypertension   Hyperlipidemia   HPI: Hyperlipidemia:  Patient is currently taking Atorvastatin 40 mg take 1 tablet daily.  Hypertension: Patient is currently taking Amlodipine 5 mg take 1 tablet daily.  GERD: omeprazole 40 mg daily.   Eating healthy and exercising - having trouble losing weight.   LUPUS: on plaquenil 200 mg one tablet twice daily. Mobic 15 mg once daily as needed.   Urinary tract infection: Patient sees Prescilla Sours with urology.  She is on d-mannose 500 mg 1 3 times daily and estradiol cream for atrophic vaginitis.  Patient was supposed to get a cystoscopy which have not seen in the records.  Current Outpatient Medications on File Prior to Visit  Medication Sig Dispense Refill   albuterol (VENTOLIN HFA) 108 (90 Base) MCG/ACT inhaler Inhale 2 puffs into the lungs every 6 (six) hours as needed for wheezing or shortness of breath. 8 g 0   amLODipine (NORVASC) 5 MG tablet TAKE 1 TABLET(5 MG) BY MOUTH DAILY 90 tablet 1   aspirin 81 MG chewable tablet Chew 81 mg by mouth daily.     atorvastatin (LIPITOR) 40 MG tablet TAKE 1 TABLET(40 MG) BY MOUTH AT BEDTIME 90 tablet 0   cyclobenzaprine (FLEXERIL) 5 MG tablet Take 1 tablet (5 mg total) by mouth at bedtime as needed for muscle spasms. 30 tablet 2   estradiol (ESTRACE) 0.1 MG/GM vaginal cream SMARTSIG:Gram(s) Vaginal 3 Times a Week     hydroxychloroquine (PLAQUENIL) 200 MG tablet TAKE 1 TABLET BY MOUTH TWICE DAILY 60 tablet 5   meloxicam (MOBIC) 15 MG tablet Take 1 tablet (15 mg total) by mouth daily. 30 tablet 0   omeprazole (PRILOSEC) 40 MG capsule Take 1 capsule (40 mg total) by mouth daily. 90 capsule 3   No current facility-administered medications on file prior to visit.   Past Medical History:  Diagnosis Date   Attention deficit hyperactivity disorder, predominantly  inattentive type    prefers no medicines.   Cough 11/07/2020   Dizziness and giddiness 01/14/2020   Encounter for immunization 08/28/2020   Essential (primary) hypertension 2016   Essential hypertension, benign 08/28/2020   Fatigue 11/07/2020   Insomnia 01/14/2020   Left-sided headache 01/14/2020   Migraine with aura, intractable, without status migrainosus 1980   Mixed hyperlipidemia 08/28/2020   Other specified nontoxic goiter    Overweight    Paresthesias 01/14/2020   SLE (systemic lupus erythematosus related syndrome) ([facility]) 01/14/2020   Systemic lupus erythematosus, organ or system involvement unspecified ([facility]) 2006   Past Surgical History:  Procedure Laterality Date   CESAREAN SECTION      Family History  Problem Relation Age of Onset   Congestive Heart Failure Father    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  Tobacco Use   Smoking status: Former    Types: Cigarettes    Quit date: 11/09/1982    Years since quitting: 39.4   Smokeless tobacco: Never  Vaping Use   Vaping Use: Never used  Substance and Sexual Activity   Alcohol use: Not Currently  Drug use: Never   Sexual activity: Yes  Other Topics Concern   Not on file  Social History Narrative   Right handed   Lives with husband   Social Determinants of Health   Financial Resource Strain: Not on file  Food Insecurity: Not on file  Transportation Needs: Not on file  Physical Activity: Not on file  Stress: Not on file  Social Connections: Not on file    Review of Systems  Constitutional:  Negative for appetite change, fatigue and fever.  HENT:  Negative for congestion, ear pain, sinus pressure and sore throat.   Respiratory:  Negative for cough, chest tightness, shortness of  breath and wheezing.   Cardiovascular:  Negative for chest pain and palpitations.  Gastrointestinal:  Negative for abdominal pain, constipation, diarrhea, nausea and vomiting.  Genitourinary:  Negative for dysuria and hematuria.  Musculoskeletal:  Negative for arthralgias, back pain, joint swelling and myalgias.  Skin:  Negative for rash.  Neurological:  Negative for dizziness, weakness and headaches.  Psychiatric/Behavioral:  Negative for dysphoric mood. The patient is not nervous/anxious.      Objective:  BP 128/78 (BP Location: Left Arm, Patient Position: Sitting)   Pulse 78   Temp (!) 97.3 F (36.3 C) (Temporal)   Ht 5' 6.5" (1.689 m)   Wt 194 lb (88 kg)   SpO2 98%   BMI 30.84 kg/m      04/16/2022    9:01 AM 12/29/2021    2:51 PM 11/27/2021    8:29 AM  BP/Weight  Systolic BP 128 140 124  Diastolic BP 78 84 84  Wt. (Lbs) 194 198 199  BMI 30.84 kg/m2 31.48 kg/m2 31.64 kg/m2    Physical Exam Vitals reviewed.  Constitutional:      Appearance: Normal appearance. She is normal weight.  Cardiovascular:     Rate and Rhythm: Normal rate and regular rhythm.     Heart sounds: Normal heart sounds.  Pulmonary:     Effort: Pulmonary effort is normal.     Breath sounds: Normal breath sounds.  Abdominal:     General: Abdomen is flat. Bowel sounds are normal.     Palpations: Abdomen is soft.  Neurological:     Mental Status: She is alert and oriented to person, place, and time.  Psychiatric:        Mood and Affect: Mood normal.        Behavior: Behavior normal.    Diabetic Foot Exam - Simple   No data filed      Lab Results  Component Value Date   WBC 3.9 04/18/2021   HGB 13.1 04/18/2021   HCT 40.8 04/18/2021   PLT 251 04/18/2021   GLUCOSE 91 04/18/2021   CHOL 155 04/18/2021   TRIG 55 04/18/2021   HDL 49 04/18/2021   LDLCALC 95 04/18/2021   ALT 29 04/18/2021   AST 23 04/18/2021   NA 141 04/18/2021   K 4.3 04/18/2021   CL 102 04/18/2021   CREATININE 1.00  04/18/2021   BUN 13 04/18/2021   CO2 25 04/18/2021   TSH 2.360 01/15/2021      Assessment & Plan:   Problem List Items Addressed This Visit       Cardiovascular and Mediastinum   Essential hypertension, benign - Primary   Relevant Orders   CBC With Diff/Platelet   Comprehensive metabolic panel     Musculoskeletal and Integument   SLE (systemic lupus erythematosus related syndrome) ([facility])     Other  Obesity (BMI 30.0-34.9)   Mixed hyperlipidemia   Relevant Orders   CBC With Diff/Platelet   Lipid panel  .  No orders of the defined types were placed in this encounter.   Orders Placed This Encounter  Procedures   CBC With Diff/Platelet   Comprehensive metabolic panel   Lipid panel     Follow-up: Return in about 6 months (around 10/16/2022) for cpe fasting.  An After Visit Summary was printed and given to the patient.  Total time spent on today's visit was greater than 30 minutes, including both face-to-face time and nonface-to-face time personally spent on review of chart (labs and imaging), discussing labs and goals, discussing further work-up, treatment options, referrals to specialist if needed, reviewing outside records of pertinent, answering patient's questions, and coordinating care.   I,Lauren M Auman,acting as a scribe for Rochel Brome, MD.,have documented all relevant documentation on the behalf of Rochel Brome, MD,as directed by  Rochel Brome, MD while in the presence of Rochel Brome, MD.    Rochel Brome, MD [place] [phone]

## 2022-04-16 NOTE — Patient Instructions (Addendum)
Recommend call nutritionist. Recommend call urology to see if patient needs a cystoscopy still .

## 2022-04-19 NOTE — Assessment & Plan Note (Signed)
Management per specialist The current medical regimen is effective;  continue present plan and medications.  

## 2022-04-19 NOTE — Assessment & Plan Note (Signed)
Well controlled.  ?No changes to medicines.  ?Continue to work on eating a healthy diet and exercise.  ?Labs drawn today.  ?

## 2022-04-19 NOTE — Assessment & Plan Note (Signed)
Recommend continue to work on eating healthy diet and exercise.  

## 2022-04-23 ENCOUNTER — Other Ambulatory Visit: Payer: Self-pay | Admitting: Family Medicine

## 2022-05-04 ENCOUNTER — Other Ambulatory Visit: Payer: Self-pay

## 2022-05-29 ENCOUNTER — Encounter: Payer: Self-pay | Admitting: Nurse Practitioner

## 2022-05-29 ENCOUNTER — Ambulatory Visit: Payer: BC Managed Care – PPO | Admitting: Nurse Practitioner

## 2022-05-29 VITALS — BP 120/70 | HR 69 | Temp 97.1°F | Ht 66.5 in | Wt 201.0 lb

## 2022-05-29 DIAGNOSIS — S61101A Unspecified open wound of right thumb with damage to nail, initial encounter: Secondary | ICD-10-CM | POA: Diagnosis not present

## 2022-05-29 MED ORDER — SULFAMETHOXAZOLE-TRIMETHOPRIM 800-160 MG PO TABS: 1.0000 | ORAL_TABLET | Freq: Two times a day (BID) | ORAL | 0 refills | Status: DC

## 2022-05-29 MED ORDER — OMEPRAZOLE 40 MG PO CPDR: 40.0000 mg | DELAYED_RELEASE_CAPSULE | Freq: Every day | ORAL | 3 refills | Status: DC

## 2022-05-29 NOTE — Patient Instructions (Addendum)
Take Bactrim twice daily (with food) for 7 days Protect right thumb from injury as much as possible Follow-up as needed   Nail Bed Injury The nail bed is the soft tissue under a fingernail or toenail. The nail bed can be injured in different ways. You may: Get bruising or bleeding under the nail. Get cuts in the nail or nail bed. Lose all or part of the nail. After your nail is hurt or torn off, it can take many months to grow again. The nail may not grow back normally. What are the causes? This condition is usually caused by crushing, pinching, cutting, or tearing injuries of the fingertip or toe. These injuries might happen when a finger or toe gets: Caught in a door. Hit by a hammer. Damaged in accidents while you are using power tools or machinery. What are the signs or symptoms? Symptoms vary depending on the type of injury. Symptoms may include: Pain in the injured area. Bleeding. Swelling. A change in color of the area. Collection of blood under the nail (hematoma). Damage to the nail, such as: A deformed or split nail. A loose nail that is not stuck to the nail bed. Loss of all or part of the nail. How is this treated? Treatment may depend on the type of injury. Some injuries may not need any treatment other than keeping the area clean and free of infection. Treatment may include: Draining blood from under the nail. This can be done by making a small hole in the nail. Removing all or part of your nail. This might be done in order to stitch (suture) any cut in the nail bed. Stitching a torn-off nail back in place. Putting bandages (dressings) or splints on the area. Taking medicines, such as: Antibiotic medicine to help prevent infection. Pain medicine. Having a tetanus shot. This may be needed if you have not had a tetanus shot in the last 10 years. Follow these instructions at home: Managing pain, stiffness, and swelling Raise (elevate) the injured area above the level  of your heart while you are sitting or lying down. Keep your injury protected with bandages or splints as told by your doctor. For an injured toenail: Try not to walk on your injured leg. Wear an open-toed shoe when you walk. Try not to let your leg hang down (dangle) when you are sitting or lying down. Wound care Follow any wound care instructions given by your doctor. Make sure you: Keep the injured area clean. Keep any bandages clean and dry. Wash your hands with soap and water for at least 20 seconds before and after you change any bandage. If you cannot use soap and water, use hand sanitizer. Change or take off the bandage only as told by your doctor. Leave any stitches in place. These may need to stay in place for 2 weeks. Check the injured area every day for signs of infection. Check for: More redness, swelling, or pain. More fluid or blood. Warmth. Pus or a bad smell. General instructions Take over-the-counter and prescription medicines only as told by your doctor. If you were prescribed an antibiotic medicine, use it as told by your doctor. Do not stop using the antibiotic even if you start to feel better. Ask your doctor if the medicine prescribed to you requires you to avoid driving or using machinery. Keep all follow-up visits as told by your doctor. This is important. Contact a doctor if: Medicine does not help your pain. You have any of  these problems in the injured area: More redness. More pain or swelling. More fluid or blood coming from the area. The area feels warm to the touch. Pus or a bad smell coming from the area. You have a fever and your symptoms get worse. Get help right away if: You lose feeling (have numbness) in your finger or toe. Your finger or toe turns blue. Summary The nail bed is the soft tissue under a fingernail or toenail. Sometimes, after a nail or nail bed is injured, the nail may not grow back normally. Raise (elevate) the injured area  above the level of your heart while you are sitting or lying down. Keep any bandages clean and dry. Change or take them off only as told by your doctor. Take over-the-counter and prescription medicines only as told by your doctor. This information is not intended to replace advice given to you by your health care provider. Make sure you discuss any questions you have with your health care provider. Document Revised: 08/11/2019 Document Reviewed: 08/11/2019 Elsevier Patient Education  2023 [employer] Nail Avulsion Nail avulsion is when a nail tears away from the nail bed due to an accident or injury. Nail avulsion can be painful. Your finger or toe may bleed a lot, and you may have some pain, redness, throbbing, and swelling while it heals. Your nail will grow back within several months. Once it grows back, it might not look the same as the old nail. This may happen even after taking good care of it. Follow these instructions at home: Wound care Follow instructions from your health care provider about how to take care of your wound. Make sure you: Wash your hands with soap and water before and after you change your bandage (dressing). If soap and water are not available, use hand sanitizer. Change your dressing as told by your health care provider. Leave stitches (sutures), skin glue, or adhesive strips in place, if present. These skin closures may need to stay in place for 2 weeks or longer. If adhesive strip edges start to loosen and curl up, you may trim the loose edges. Do not remove adhesive strips completely unless your health care provider tells you to do that. Check your wound every day for signs of infection. Check for: Redness, swelling, or pain. Fluid or blood. Warmth. Pus or a bad smell. Do not take baths, swim, or use a hot tub until your health care provider approves. Ask your health care provider if you may take showers. You may only be allowed to take sponge baths. If you  notice bleeding, press gently on the nail bed with a gauze pad. Do this for 15 minutes. Keep the wound dry for 48 hours. After 48 hours have passed, lightly wash the finger or toe in warm, soapy water 2-3 times a day. This helps to reduce pain and swelling. It also prevents infection. Medicine Take over-the-counter and prescription medicines only as told by your health care provider. Do not take aspirin or products containing aspirin unless directed by your health care provider. These products can increase bleeding. If you were prescribed an antibiotic medicine, take it or apply it as told by your health care provider. Do not stop taking or using the antibiotic even if you start to feel better. General instructions  Keep the injured hand or foot raised above the level of your heart as much as possible in the first 48 hours after the injury. This helps to reduce pain and swelling. Move  the toe or finger often to avoid stiffness. Do not use any products that contain nicotine or tobacco, such as cigarettes, e-cigarettes, and chewing tobacco. These can delay healing. If you need help quitting, ask your health care provider. Contact a health care provider if: You have increased redness, swelling, or pain around your wound. You have fluid or blood coming from your wound. You have pus or a bad smell coming from your wound. Your wound or the area around your wound feels warm to the touch. Get help right away if: You have bleeding that does not stop, even when you apply pressure to the wound. You have a temperature that is higher than 100.69F (38C). The affected finger or toe looks white or black. Summary Nail avulsion is when a nail tears away from the nail bed due to an accident or injury. Follow instructions from your health care provider about how to take care of your wound. Keep the injured hand or foot raised above the level of your heart as much as possible in the first 48 hours after the  injury. This helps to reduce pain and swelling. Check your wound every day for signs of infection. Contact a health care provider if you have increased redness, swelling, or pain around your wound. This information is not intended to replace advice given to you by your health care provider. Make sure you discuss any questions you have with your health care provider. Document Revised: 08/21/2021 Document Reviewed: 08/21/2021 Elsevier Patient Education  2023 [employer].

## 2022-05-29 NOTE — Progress Notes (Unsigned)
 Acute Office Visit  Subjective:    Patient ID: Kathy Villa, female    DOB: 07/14/1961, 60 y.o.   MRN: [id number]  Chief Complaint  Patient presents with   Edema    HPI: Patient is in today for Pain  She reports new onset right thumb pain. was not an injury that may have caused the pain. The pain started a few days ago and is worsening. The pain does radiate up her hand. The pain is described as sharp, is moderate in intensity, occurring intermittently.   Past Medical History:  Diagnosis Date   Attention deficit hyperactivity disorder, predominantly inattentive type    prefers no medicines.   Cough 11/07/2020   Dizziness and giddiness 01/14/2020   Encounter for immunization 08/28/2020   Essential (primary) hypertension 2016   Essential hypertension, benign 08/28/2020   Fatigue 11/07/2020   Insomnia 01/14/2020   Left-sided headache 01/14/2020   Migraine with aura, intractable, without status migrainosus 1980   Mixed hyperlipidemia 08/28/2020   Other specified nontoxic goiter    Overweight    Paresthesias 01/14/2020   SLE (systemic lupus erythematosus related syndrome) (HCC) 01/14/2020   Systemic lupus erythematosus, organ or system involvement unspecified (HCC) 2006    Past Surgical History:  Procedure Laterality Date   CESAREAN SECTION      Family History  Problem Relation Age of Onset   Congestive Heart Failure Father     Social History   Socioeconomic History   Marital status: Married    Spouse name: Not on file   Number of children: 3   Years of education: Not on file   Highest education level: Not on file  Occupational History   Occupation: Administrator- [employer]  Tobacco Use   Smoking status: Former    Types: Cigarettes    Quit date: 11/09/1982    Years since quitting: 39.5   Smokeless tobacco: Never  Vaping Use   Vaping Use: Never used  Substance and Sexual Activity   Alcohol use: Not Currently   Drug use: Never   Sexual  activity: Yes  Other Topics Concern   Not on file  Social History Narrative   Right handed   Lives with husband   Social Determinants of Health   Financial Resource Strain: Not on file  Food Insecurity: Not on file  Transportation Needs: Not on file  Physical Activity: Not on file  Stress: Not on file  Social Connections: Not on file  Intimate Partner Violence: Not on file    Outpatient Medications Prior to Visit  Medication Sig Dispense Refill   albuterol (VENTOLIN HFA) 108 (90 Base) MCG/ACT inhaler Inhale 2 puffs into the lungs every 6 (six) hours as needed for wheezing or shortness of breath. 8 g 0   amLODipine (NORVASC) 5 MG tablet TAKE 1 TABLET(5 MG) BY MOUTH DAILY 90 tablet 1   aspirin 81 MG chewable tablet Chew 81 mg by mouth daily.     atorvastatin (LIPITOR) 40 MG tablet TAKE 1 TABLET(40 MG) BY MOUTH AT BEDTIME 90 tablet 0   cyclobenzaprine (FLEXERIL) 5 MG tablet Take 1 tablet (5 mg total) by mouth at bedtime as needed for muscle spasms. 30 tablet 2   estradiol (ESTRACE) 0.1 MG/GM vaginal cream SMARTSIG:Gram(s) Vaginal 3 Times a Week     hydroxychloroquine (PLAQUENIL) 200 MG tablet TAKE 1 TABLET BY MOUTH TWICE DAILY 60 tablet 5   meloxicam (MOBIC) 15 MG tablet Take 1 tablet (15 mg total) by   mouth daily. 30 tablet 0   omeprazole (PRILOSEC) 40 MG capsule Take 1 capsule (40 mg total) by mouth daily. 90 capsule 3   No facility-administered medications prior to visit.    No Known Allergies  Review of Systems     Objective:    Physical Exam  Wt 201 lb (91.2 kg)   BMI 31.96 kg/m  Wt Readings from Last 3 Encounters:  05/29/22 201 lb (91.2 kg)  04/16/22 194 lb (88 kg)  12/29/21 198 lb (89.8 kg)    Health Maintenance Due  Topic Date Due   HIV Screening  Never done   Hepatitis C Screening  Never done   TETANUS/TDAP  Never done   Zoster Vaccines- Shingrix (1 of 2) Never done   PAP SMEAR-Modifier  06/09/2014   COVID-19 Vaccine (5 - Pfizer series) 06/13/2021    MAMMOGRAM  04/15/2022    There are no preventive care reminders to display for this patient.   Lab Results  Component Value Date   TSH 2.360 01/15/2021   Lab Results  Component Value Date   WBC 3.9 04/18/2021   HGB 13.1 04/18/2021   HCT 40.8 04/18/2021   MCV 94 04/18/2021   PLT 251 04/18/2021   Lab Results  Component Value Date   NA 141 04/18/2021   K 4.3 04/18/2021   CO2 25 04/18/2021   GLUCOSE 91 04/18/2021   BUN 13 04/18/2021   CREATININE 1.00 04/18/2021   BILITOT 0.4 04/18/2021   ALKPHOS 84 04/18/2021   AST 23 04/18/2021   ALT 29 04/18/2021   PROT 7.0 04/18/2021   ALBUMIN 4.5 04/18/2021   CALCIUM 9.3 04/18/2021   EGFR 64 04/18/2021   Lab Results  Component Value Date   CHOL 155 04/18/2021   Lab Results  Component Value Date   HDL 49 04/18/2021   Lab Results  Component Value Date   LDLCALC 95 04/18/2021   Lab Results  Component Value Date   TRIG 55 04/18/2021   Lab Results  Component Value Date   CHOLHDL 3.2 04/18/2021   No results found for: "HGBA1C"     Assessment & Plan:   Problem List Items Addressed This Visit   None  No orders of the defined types were placed in this encounter.   No orders of the defined types were placed in this encounter.    Follow-up: No follow-ups on file.  An After Visit Summary was printed and given to the patient.  Rip Harbour, NP [place] [phone]

## 2022-07-21 ENCOUNTER — Other Ambulatory Visit: Payer: Self-pay | Admitting: Family Medicine

## 2022-08-05 ENCOUNTER — Ambulatory Visit: Payer: BC Managed Care – PPO | Admitting: Physician Assistant

## 2022-08-05 ENCOUNTER — Encounter: Payer: Self-pay | Admitting: Physician Assistant

## 2022-08-05 VITALS — BP 130/80 | HR 66 | Temp 97.4°F | Ht 66.5 in | Wt 197.8 lb

## 2022-08-05 DIAGNOSIS — N3000 Acute cystitis without hematuria: Secondary | ICD-10-CM | POA: Diagnosis not present

## 2022-08-05 LAB — POCT URINALYSIS DIP (CLINITEK)
Bilirubin, UA: NEGATIVE
Blood, UA: NEGATIVE
Glucose, UA: NEGATIVE mg/dL
Ketones, POC UA: NEGATIVE mg/dL
Nitrite, UA: NEGATIVE
POC PROTEIN,UA: NEGATIVE
Spec Grav, UA: 1.01 (ref 1.010–1.025)
Urobilinogen, UA: 0.2 E.U./dL
pH, UA: 6 (ref 5.0–8.0)

## 2022-08-05 MED ORDER — NITROFURANTOIN MONOHYD MACRO 100 MG PO CAPS: 100.0000 mg | ORAL_CAPSULE | Freq: Two times a day (BID) | ORAL | 0 refills | Status: DC

## 2022-08-05 NOTE — Progress Notes (Signed)
Acute Office Visit  Subjective:    Patient ID: Kathy Villa, female    DOB: 01-Dec-1960, 61 y.o.   MRN: [id number]  Chief Complaint  Patient presents with   Back Pain    lower    HPI: Patient is in today for complaints of lower back discomfort that started recently with malaise - has a history of utis in past that usually begin with similar symptoms Has had some frequency but no dysuria or hematuria  Past Medical History:  Diagnosis Date   Attention deficit hyperactivity disorder, predominantly inattentive type    prefers no medicines.   Cough 11/07/2020   Dizziness and giddiness 01/14/2020   Encounter for immunization 08/28/2020   Essential (primary) hypertension 2016   Essential hypertension, benign 08/28/2020   Fatigue 11/07/2020   Insomnia 01/14/2020   Left-sided headache 01/14/2020   Migraine with aura, intractable, without status migrainosus 1980   Mixed hyperlipidemia 08/28/2020   Other specified nontoxic goiter    Overweight    Paresthesias 01/14/2020   SLE (systemic lupus erythematosus related syndrome) ([place]) 01/14/2020   Systemic lupus erythematosus, organ or system involvement unspecified ([place]) 2006    Past Surgical History:  Procedure Laterality Date   CESAREAN SECTION      Family History  Problem Relation Age of Onset   Congestive Heart Failure Father     Social History   Socioeconomic History   Marital status: Married    Spouse name: Not on file   Number of children: 3   Years of education: Not on file   Highest education level: Not on file  Occupational History   Occupation: Scientist, physiological- [employer]  Tobacco Use   Smoking status: Former    Types: Cigarettes    Quit date: 11/09/1982    Years since quitting: 39.7   Smokeless tobacco: Never  Vaping Use   Vaping Use: Never used  Substance and Sexual Activity   Alcohol use: Not Currently   Drug use: Never   Sexual activity: Yes  Other Topics Concern   Not on file   Social History Narrative   Right handed   Lives with husband   Social Determinants of Health   Financial Resource Strain: Not on file  Food Insecurity: Not on file  Transportation Needs: Not on file  Physical Activity: Not on file  Stress: Not on file  Social Connections: Not on file  Intimate Partner Violence: Not on file    Outpatient Medications Prior to Visit  Medication Sig Dispense Refill   albuterol (VENTOLIN HFA) 108 (90 Base) MCG/ACT inhaler Inhale 2 puffs into the lungs every 6 (six) hours as needed for wheezing or shortness of breath. 8 g 0   amLODipine (NORVASC) 5 MG tablet TAKE 1 TABLET(5 MG) BY MOUTH DAILY 90 tablet 1   estradiol (ESTRACE) 0.1 MG/GM vaginal cream SMARTSIG:Gram(s) Vaginal 3 Times a Week     hydroxychloroquine (PLAQUENIL) 200 MG tablet TAKE 1 TABLET(200 MG) BY MOUTH TWICE DAILY 180 tablet 0   omeprazole (PRILOSEC) 40 MG capsule Take 1 capsule (40 mg total) by mouth daily. 90 capsule 3   aspirin 81 MG chewable tablet Chew 81 mg by mouth daily.     atorvastatin (LIPITOR) 40 MG tablet TAKE 1 TABLET(40 MG) BY MOUTH AT BEDTIME 90 tablet 0   cyclobenzaprine (FLEXERIL) 5 MG tablet Take 1 tablet (5 mg total) by mouth at bedtime as needed for muscle spasms. 30 tablet 2   meloxicam (MOBIC) 15  MG tablet Take 1 tablet (15 mg total) by mouth daily. 30 tablet 0   sulfamethoxazole-trimethoprim (BACTRIM DS) 800-160 MG tablet Take 1 tablet by mouth 2 (two) times daily. 14 tablet 0   No facility-administered medications prior to visit.    No Known Allergies  Review of Systems CONSTITUTIONAL: Negative for chills, fatigue, fever,  CARDIOVASCULAR: Negative for chest pain, dizziness RESPIRATORY: Negative for recent cough and dyspnea.  GASTROINTESTINAL: Negative for abdominal pain, acid reflux symptoms, constipation, diarrhea, nausea and vomiting.  GU - see HPI     Objective:  PHYSICAL EXAM:   VS: BP 130/80 (BP Location: Left Arm, Patient Position: Sitting, Cuff  Size: Normal)   Pulse 66   Temp (!) 97.4 F (36.3 C) (Temporal)   Ht 5' 6.5" (1.689 m)   Wt 197 lb 12.8 oz (89.7 kg)   BMI 31.45 kg/m   GEN: Well nourished, well developed, in no acute distress  Cardiac: RRR; no murmurs, rubs Respiratory:  normal respiratory rate and pattern with no distress - normal breath sounds with no rales, rhonchi, wheezes or rubs  Skin: warm and dry, no rash   Office Visit on 08/05/2022  Component Date Value Ref Range Status   Color, UA 08/05/2022 colorless (A)  yellow Final   Clarity, UA 08/05/2022 clear  clear Final   Glucose, UA 08/05/2022 negative  negative mg/dL Final   Bilirubin, UA 08/05/2022 negative  negative Final   Ketones, POC UA 08/05/2022 negative  negative mg/dL Final   Spec Grav, UA 08/05/2022 1.010  1.010 - 1.025 Final   Blood, UA 08/05/2022 negative  negative Final   pH, UA 08/05/2022 6.0  5.0 - 8.0 Final   POC PROTEIN,UA 08/05/2022 negative  negative, trace Final   Urobilinogen, UA 08/05/2022 0.2  0.2 or 1.0 E.U./dL Final   Nitrite, UA 08/05/2022 Negative  Negative Final   Leukocytes, UA 08/05/2022 Moderate (2+) (A)  Negative Final       Health Maintenance Due  Topic Date Due   HIV Screening  Never done   Hepatitis C Screening  Never done   TETANUS/TDAP  Never done   Zoster Vaccines- Shingrix (1 of 2) Never done   PAP SMEAR-Modifier  06/09/2014   COVID-19 Vaccine (5 - Pfizer series) 06/13/2021   MAMMOGRAM  04/15/2022   INFLUENZA VACCINE  06/09/2022    There are no preventive care reminders to display for this patient.   Lab Results  Component Value Date   TSH 2.360 01/15/2021   Lab Results  Component Value Date   WBC 3.9 04/18/2021   HGB 13.1 04/18/2021   HCT 40.8 04/18/2021   MCV 94 04/18/2021   PLT 251 04/18/2021   Lab Results  Component Value Date   NA 141 04/18/2021   K 4.3 04/18/2021   CO2 25 04/18/2021   GLUCOSE 91 04/18/2021   BUN 13 04/18/2021   CREATININE 1.00 04/18/2021   BILITOT 0.4 04/18/2021    ALKPHOS 84 04/18/2021   AST 23 04/18/2021   ALT 29 04/18/2021   PROT 7.0 04/18/2021   ALBUMIN 4.5 04/18/2021   CALCIUM 9.3 04/18/2021   EGFR 64 04/18/2021   Lab Results  Component Value Date   CHOL 155 04/18/2021   Lab Results  Component Value Date   HDL 49 04/18/2021   Lab Results  Component Value Date   LDLCALC 95 04/18/2021   Lab Results  Component Value Date   TRIG 55 04/18/2021   Lab Results  Component Value Date  CHOLHDL 3.2 04/18/2021   No results found for: "HGBA1C"     Assessment & Plan:   Problem List Items Addressed This Visit       Genitourinary   Acute cystitis without hematuria - Primary   Relevant Medications   nitrofurantoin, macrocrystal-monohydrate, (MACROBID) 100 MG capsule   Other Relevant Orders   POCT URINALYSIS DIP (CLINITEK) (Completed)   Urine Culture   Meds ordered this encounter  Medications   nitrofurantoin, macrocrystal-monohydrate, (MACROBID) 100 MG capsule    Sig: Take 1 capsule (100 mg total) by mouth 2 (two) times daily.    Dispense:  20 capsule    Refill:  0    Order Specific Question:   Supervising Provider    AnswerShelton Silvas    Orders Placed This Encounter  Procedures   Urine Culture   POCT URINALYSIS DIP (CLINITEK)     Follow-up: Return in about 2 weeks (around 08/19/2022) for chronic fasting follow up with Dr Tobie Poet.  An After Visit Summary was printed and given to the patient.  Yetta Flock [facility] [phone]

## 2022-08-06 LAB — URINE CULTURE

## 2022-08-21 ENCOUNTER — Ambulatory Visit: Payer: BC Managed Care – PPO | Admitting: Family Medicine

## 2022-09-02 ENCOUNTER — Encounter: Payer: Self-pay | Admitting: Family Medicine

## 2022-09-02 DIAGNOSIS — K219 Gastro-esophageal reflux disease without esophagitis: Secondary | ICD-10-CM

## 2022-09-02 DIAGNOSIS — Z1231 Encounter for screening mammogram for malignant neoplasm of breast: Secondary | ICD-10-CM | POA: Insufficient documentation

## 2022-09-02 HISTORY — DX: Gastro-esophageal reflux disease without esophagitis: K21.9

## 2022-09-02 HISTORY — DX: Encounter for screening mammogram for malignant neoplasm of breast: Z12.31

## 2022-09-02 NOTE — Assessment & Plan Note (Signed)
Well controlled.  ?No changes to medicines. Continue amlodipine 5 mg daily.  ?Continue to work on eating a healthy diet and exercise.  ?Labs drawn today.  ? ?

## 2022-09-02 NOTE — Assessment & Plan Note (Signed)
Well controlled.  No changes to medicines. Continue  Atorvastatin 40 mg take 1 tablet daily. Continue to work on eating a healthy diet and exercise.  Labs drawn today.

## 2022-09-02 NOTE — Assessment & Plan Note (Signed)
The current medical regimen is effective;  continue present plan and medications. Continue omeprazole 40 mg daily  

## 2022-09-02 NOTE — Progress Notes (Signed)
Subjective:  Patient ID: Kathy Villa, female    DOB: [birth year]  Age: 61 y.o. MRN: [id number]  Chief Complaint  Patient presents with   Hypertension   Hyperlipidemia   HPI: Hyperlipidemia:  Patient is currently taking Atorvastatin 40 mg take 1 tablet daily. Healthy diet. Exercising.  Hypertension: Patient is currently taking Amlodipine 5 mg take 1 tablet daily.  GERD: omeprazole 40 mg daily.   LUPUS: on plaquenil 200 mg one tablet twice daily.  Urinary tract infection: Patient sees Prescilla Sours with urology.  She is on d-mannose 500 mg 1- 3 times daily and estradiol cream for atrophic vaginitis.  Patient completed course of macrobid given at the end of September. Symptoms improved, but worsened again.   Left sided sciatica. It can becomes excruciating when she holds her urine if trying to get up to go to the bathroom. Patient is always aware of discomfort and will have worsening pain with certain activities.   Current Outpatient Medications on File Prior to Visit  Medication Sig Dispense Refill   albuterol (VENTOLIN HFA) 108 (90 Base) MCG/ACT inhaler Inhale 2 puffs into the lungs every 6 (six) hours as needed for wheezing or shortness of breath. 8 g 0   amLODipine (NORVASC) 5 MG tablet TAKE 1 TABLET(5 MG) BY MOUTH DAILY 90 tablet 1   estradiol (ESTRACE) 0.1 MG/GM vaginal cream SMARTSIG:Gram(s) Vaginal 3 Times a Week     hydroxychloroquine (PLAQUENIL) 200 MG tablet TAKE 1 TABLET(200 MG) BY MOUTH TWICE DAILY 180 tablet 0   omeprazole (PRILOSEC) 40 MG capsule Take 1 capsule (40 mg total) by mouth daily. 90 capsule 3   No current facility-administered medications on file prior to visit.   Past Medical History:  Diagnosis Date   Attention deficit hyperactivity disorder, predominantly inattentive type    prefers no medicines.   Cough 11/07/2020   Dizziness and giddiness 01/14/2020   Encounter for immunization 08/28/2020   Essential (primary) hypertension 2016   Essential  hypertension, benign 08/28/2020   Fatigue 11/07/2020   Insomnia 01/14/2020   Left-sided headache 01/14/2020   Migraine with aura, intractable, without status migrainosus 1980   Mixed hyperlipidemia 08/28/2020   Other specified nontoxic goiter    Overweight    Paresthesias 01/14/2020   SLE (systemic lupus erythematosus related syndrome) (HCC) 01/14/2020   Systemic lupus erythematosus, organ or system involvement unspecified (HCC) 2006   Past Surgical History:  Procedure Laterality Date   CESAREAN SECTION      Family History  Problem Relation Age of Onset   Congestive Heart Failure Father    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  Tobacco Use   Smoking status: Former    Types: Cigarettes    Quit date: 11/09/1982    Years since quitting: 39.8   Smokeless tobacco: Never  Vaping Use   Vaping Use: Never used  Substance and Sexual Activity   Alcohol use: Not Currently   Drug use: Never   Sexual activity: Yes  Other Topics Concern   Not on file  Social History Narrative   Right handed   Lives with husband   Social Determinants of Health   Financial Resource Strain: Not on file  Food Insecurity: Not on file  Transportation Needs: Not on file  Physical Activity: Not on file  Stress: Not on file  Social Connections: Not on file    Review of Systems  Constitutional:  Positive for fatigue. Negative for chills and fever.  HENT:  Negative for congestion, ear pain, nosebleeds, sinus pain and sore throat.   Respiratory:  Negative for cough and shortness of breath.   Cardiovascular:  Negative for chest pain and leg swelling.  Gastrointestinal:  Negative for abdominal pain, constipation, diarrhea, nausea and vomiting.  Genitourinary:  Positive for  flank pain. Negative for dysuria, frequency and urgency.  Musculoskeletal:  Negative for arthralgias, back pain and myalgias.  Skin:  Negative for rash.  Neurological:  Positive for light-headedness (if does not eat.). Negative for dizziness and headaches.  Psychiatric/Behavioral:  Negative for dysphoric mood. The patient is not nervous/anxious.      Objective:  BP 118/80   Pulse 71   Temp (!) 97.5 F (36.4 C)   Ht 5' 6.5" (1.689 m)   Wt 200 lb (90.7 kg)   SpO2 99%   BMI 31.80 kg/m      09/03/2022    8:50 AM 08/05/2022    1:43 PM 05/29/2022   10:17 AM  BP/Weight  Systolic BP 118 130 120  Diastolic BP 80 80 70  Wt. (Lbs) 200 197.8 201  BMI 31.8 kg/m2 31.45 kg/m2 31.96 kg/m2    Physical Exam Vitals reviewed.  Constitutional:      Appearance: Normal appearance. She is normal weight.  Neck:     Vascular: No carotid bruit.  Cardiovascular:     Rate and Rhythm: Normal rate and regular rhythm.     Heart sounds: Normal heart sounds.  Pulmonary:     Effort: Pulmonary effort is normal. No respiratory distress.     Breath sounds: Normal breath sounds.  Abdominal:     General: Abdomen is flat. Bowel sounds are normal.     Palpations: Abdomen is soft.     Tenderness: There is no abdominal tenderness.  Musculoskeletal:        General: Tenderness (lumbar. questionable left sided SLR. negative Rt SLR.) present.  Neurological:     Mental Status: She is alert and oriented to person, place, and time.  Psychiatric:        Mood and Affect: Mood normal.        Behavior: Behavior normal.     Diabetic Foot Exam - Simple   No data filed      Lab Results  Component Value Date   WBC 4.1 09/03/2022   HGB 13.2 09/03/2022   HCT 41.0 09/03/2022   PLT 301 09/03/2022   GLUCOSE 82 09/03/2022   CHOL 188 09/03/2022   TRIG 83 09/03/2022   HDL 56 09/03/2022   LDLCALC 117 (H) 09/03/2022   ALT 31 09/03/2022   AST 26 09/03/2022   NA 140 09/03/2022   K 3.8 09/03/2022   CL 100  09/03/2022   CREATININE 0.84 09/03/2022   BUN 13 09/03/2022   CO2 24 09/03/2022   TSH 2.300 09/03/2022      Assessment & Plan:   Problem List Items Addressed This Visit       Cardiovascular and Mediastinum   Essential hypertension, benign - Primary    Well controlled.  No changes to medicines. Continue amlodipine 5 mg daily.  Continue to work on eating a healthy diet and exercise.  Labs drawn today.        Relevant Orders   CBC  with Differential/Platelet (Completed)   Comprehensive metabolic panel (Completed)   Lipid panel (Completed)   TSH (Completed)     Digestive   GERD (gastroesophageal reflux disease)    The current medical regimen is effective;  continue present plan and medications. Continue omeprazole 40 mg daily.         Nervous and Auditory   Sciatica of left side associated with disorder of lumbar spine    Order lumbar xray      Relevant Orders   DG Lumbar Spine Complete     Musculoskeletal and Integument   SLE (systemic lupus erythematosus related syndrome) ([place])    Management per specialist.  The current medical regimen is effective;  continue present plan and medications. Continue plaquenil 200 mg twice daily.         Genitourinary   Recurrent UTI    3+ leuks. Rx: cipro      Relevant Orders   POCT URINALYSIS DIP (CLINITEK) (Completed)     Other   Mixed hyperlipidemia    Well controlled.  No changes to medicines. Continue  Atorvastatin 40 mg take 1 tablet daily. Continue to work on eating a healthy diet and exercise.  Labs drawn today.        Visit for screening mammogram   Relevant Orders   MM DIGITAL SCREENING BILATERAL   Need for influenza vaccination   Relevant Orders   Flu Vaccine MDCK QUAD PF (Completed)   Other Visit Diagnoses     Need for zoster vaccination       Relevant Orders   Zoster Recombinant (Shingrix ) (Completed)   Acute cystitis without hematuria       Relevant Orders   Urine Culture (Completed)      .  Meds ordered this encounter  Medications   ciprofloxacin (CIPRO) 500 MG tablet    Sig: Take 1 tablet (500 mg total) by mouth 2 (two) times daily for 7 days.    Dispense:  14 tablet    Refill:  0    Orders Placed This Encounter  Procedures   Urine Culture   MM DIGITAL SCREENING BILATERAL   DG Lumbar Spine Complete   Flu Vaccine MDCK QUAD PF   Zoster Recombinant (Shingrix )   CBC with Differential/Platelet   Comprehensive metabolic panel   Lipid panel   TSH   Cardiovascular Risk Assessment   POCT URINALYSIS DIP (CLINITEK)     Follow-up: Return in about 6 months (around 03/05/2023) for chronic fasting, cpe overdue -  offer to schedule with other providers, as well.Marland Kitchen  An After Visit Summary was printed and given to the patient.  Rochel Brome, MD [facility] [phone]

## 2022-09-02 NOTE — Assessment & Plan Note (Addendum)
Management per specialist.  The current medical regimen is effective;  continue present plan and medications. Continue plaquenil 200 mg twice daily.

## 2022-09-03 ENCOUNTER — Ambulatory Visit: Payer: BC Managed Care – PPO | Admitting: Family Medicine

## 2022-09-03 VITALS — BP 118/80 | HR 71 | Temp 97.5°F | Ht 66.5 in | Wt 200.0 lb

## 2022-09-03 DIAGNOSIS — Z23 Encounter for immunization: Secondary | ICD-10-CM

## 2022-09-03 DIAGNOSIS — M329 Systemic lupus erythematosus, unspecified: Secondary | ICD-10-CM | POA: Diagnosis not present

## 2022-09-03 DIAGNOSIS — Z1231 Encounter for screening mammogram for malignant neoplasm of breast: Secondary | ICD-10-CM | POA: Diagnosis not present

## 2022-09-03 DIAGNOSIS — N390 Urinary tract infection, site not specified: Secondary | ICD-10-CM

## 2022-09-03 DIAGNOSIS — E782 Mixed hyperlipidemia: Secondary | ICD-10-CM | POA: Diagnosis not present

## 2022-09-03 DIAGNOSIS — M5386 Other specified dorsopathies, lumbar region: Secondary | ICD-10-CM

## 2022-09-03 DIAGNOSIS — K219 Gastro-esophageal reflux disease without esophagitis: Secondary | ICD-10-CM

## 2022-09-03 DIAGNOSIS — I10 Essential (primary) hypertension: Secondary | ICD-10-CM | POA: Diagnosis not present

## 2022-09-03 DIAGNOSIS — N3000 Acute cystitis without hematuria: Secondary | ICD-10-CM

## 2022-09-03 HISTORY — DX: Urinary tract infection, site not specified: N39.0

## 2022-09-03 HISTORY — DX: Other specified dorsopathies, lumbar region: M53.86

## 2022-09-03 HISTORY — DX: Encounter for immunization: Z23

## 2022-09-03 LAB — POCT URINALYSIS DIP (CLINITEK)
Bilirubin, UA: NEGATIVE
Blood, UA: NEGATIVE
Glucose, UA: NEGATIVE mg/dL
Ketones, POC UA: NEGATIVE mg/dL
Nitrite, UA: NEGATIVE
POC PROTEIN,UA: NEGATIVE
Spec Grav, UA: <=1.005 — AB (ref 1.010–1.025)
Urobilinogen, UA: 0.2 E.U./dL
pH, UA: 6.5 (ref 5.0–8.0)

## 2022-09-03 MED ORDER — CIPROFLOXACIN HCL 500 MG PO TABS: 500.0000 mg | ORAL_TABLET | Freq: Two times a day (BID) | ORAL | 0 refills | Status: AC

## 2022-09-03 NOTE — Assessment & Plan Note (Signed)
Order lumbar x-ray. I encouraged patient to use a walker and move into a single level home. Patient disagrees and wants to stay in his current home.  I could order an MRI, but surgery is not an option anyway, so no MRI was ordered.

## 2022-09-03 NOTE — Assessment & Plan Note (Addendum)
3+ leuks. Rx: cipro

## 2022-09-04 LAB — CBC WITH DIFFERENTIAL/PLATELET
Basophils Absolute: 0 10*3/uL (ref 0.0–0.2)
Basos: 1 %
EOS (ABSOLUTE): 0.1 10*3/uL (ref 0.0–0.4)
Eos: 3 %
Hematocrit: 41 % (ref 34.0–46.6)
Hemoglobin: 13.2 g/dL (ref 11.1–15.9)
Immature Grans (Abs): 0 10*3/uL (ref 0.0–0.1)
Immature Granulocytes: 0 %
Lymphocytes Absolute: 1.9 10*3/uL (ref 0.7–3.1)
Lymphs: 45 %
MCH: 30.5 pg (ref 26.6–33.0)
MCHC: 32.2 g/dL (ref 31.5–35.7)
MCV: 95 fL (ref 79–97)
Monocytes Absolute: 0.3 10*3/uL (ref 0.1–0.9)
Monocytes: 8 %
Neutrophils Absolute: 1.8 10*3/uL (ref 1.4–7.0)
Neutrophils: 43 %
Platelets: 301 10*3/uL (ref 150–450)
RBC: 4.33 x10E6/uL (ref 3.77–5.28)
RDW: 12 % (ref 11.7–15.4)
WBC: 4.1 10*3/uL (ref 3.4–10.8)

## 2022-09-04 LAB — COMPREHENSIVE METABOLIC PANEL
ALT: 31 IU/L (ref 0–32)
AST: 26 IU/L (ref 0–40)
Albumin/Globulin Ratio: 1.8 (ref 1.2–2.2)
Albumin: 4.9 g/dL (ref 3.9–4.9)
Alkaline Phosphatase: 99 IU/L (ref 44–121)
BUN/Creatinine Ratio: 15 (ref 12–28)
BUN: 13 mg/dL (ref 8–27)
Bilirubin Total: 0.6 mg/dL (ref 0.0–1.2)
CO2: 24 mmol/L (ref 20–29)
Calcium: 9.8 mg/dL (ref 8.7–10.3)
Chloride: 100 mmol/L (ref 96–106)
Creatinine, Ser: 0.84 mg/dL (ref 0.57–1.00)
Globulin, Total: 2.8 g/dL (ref 1.5–4.5)
Glucose: 82 mg/dL (ref 70–99)
Potassium: 3.8 mmol/L (ref 3.5–5.2)
Sodium: 140 mmol/L (ref 134–144)
Total Protein: 7.7 g/dL (ref 6.0–8.5)
eGFR: 79 mL/min/{1.73_m2} (ref >59)

## 2022-09-04 LAB — LIPID PANEL
Chol/HDL Ratio: 3.4 ratio (ref 0.0–4.4)
Cholesterol, Total: 188 mg/dL (ref 100–199)
HDL: 56 mg/dL (ref >39)
LDL Chol Calc (NIH): 117 mg/dL — ABNORMAL HIGH (ref 0–99)
Triglycerides: 83 mg/dL (ref 0–149)
VLDL Cholesterol Cal: 15 mg/dL (ref 5–40)

## 2022-09-04 LAB — CARDIOVASCULAR RISK ASSESSMENT

## 2022-09-04 LAB — TSH: TSH: 2.3 u[IU]/mL (ref 0.450–4.500)

## 2022-09-05 LAB — URINE CULTURE

## 2022-09-09 ENCOUNTER — Other Ambulatory Visit: Payer: Self-pay

## 2022-09-09 MED ORDER — ATORVASTATIN CALCIUM 40 MG PO TABS: 40.0000 mg | ORAL_TABLET | Freq: Every day | ORAL | 0 refills | Status: DC

## 2022-09-14 ENCOUNTER — Encounter: Payer: Self-pay | Admitting: Family Medicine

## 2022-10-09 ENCOUNTER — Encounter: Payer: Self-pay | Admitting: Nurse Practitioner

## 2022-10-09 ENCOUNTER — Ambulatory Visit (INDEPENDENT_AMBULATORY_CARE_PROVIDER_SITE_OTHER): Payer: BC Managed Care – PPO | Admitting: Nurse Practitioner

## 2022-10-09 VITALS — BP 134/68 | HR 65 | Temp 97.2°F | Ht 66.5 in | Wt 201.0 lb

## 2022-10-09 DIAGNOSIS — Z0000 Encounter for general adult medical examination without abnormal findings: Secondary | ICD-10-CM | POA: Diagnosis not present

## 2022-10-09 DIAGNOSIS — Z1231 Encounter for screening mammogram for malignant neoplasm of breast: Secondary | ICD-10-CM | POA: Diagnosis not present

## 2022-10-09 NOTE — Progress Notes (Signed)
Subjective:  Patient ID: Kathy Villa, female    DOB: February 16, 1961  Age: 61 y.o. MRN: [id number]  Chief Complaint  Patient presents with   Annual Exam    HPI Encounter for general adult medical examination without abnormal findings  Physical ("At Risk" items are starred): Patient's last physical exam was 1 year ago .      SDOH Screenings   Food Insecurity: No Food Insecurity (10/09/2022)  Housing: Low Risk  (10/09/2022)  Transportation Needs: No Transportation Needs (10/09/2022)  Utilities: Not At Risk (10/09/2022)  Alcohol Screen: Low Risk  (10/09/2022)  Depression (PHQ2-9): Low Risk  (10/09/2022)  Financial Resource Strain: Low Risk  (10/09/2022)  Physical Activity: Insufficiently Active (10/09/2022)  Social Connections: Moderately Integrated (10/09/2022)  Stress: No Stress Concern Present (10/09/2022)  Tobacco Use: Medium Risk (10/09/2022)       10/09/2022    8:08 AM 04/18/2021    7:59 AM 01/15/2021    8:09 AM 02/12/2020    2:03 PM  Fall Risk   Falls in the past year? 0 1 0 1  Number falls in past yr: 0 0 0 0  Injury with Fall? 0 0 0 0  Risk for fall due to : No Fall Risks No Fall Risks No Fall Risks   Follow up Falls evaluation completed Falls evaluation completed Falls evaluation completed        10/09/2022    8:08 AM 04/16/2022    9:03 AM 09/01/2020    9:28 PM 04/03/2020    8:25 AM 01/14/2020   11:43 PM  Depression screen PHQ 2/9  Decreased Interest 0 0 0 0 0  Down, Depressed, Hopeless 0 0 1 0 0  PHQ - 2 Score 0 0 1 0 0  Altered sleeping 0  1    Tired, decreased energy 0  1    Change in appetite 0  0    Feeling bad or failure about yourself  0  1    Trouble concentrating 0  1    Moving slowly or fidgety/restless 0  1    Suicidal thoughts 0  0    PHQ-9 Score 0  6    Difficult doing work/chores Not difficult at all        Functional Status Survey: Is the patient deaf or have difficulty hearing?: No Does the patient have difficulty seeing, even when wearing  glasses/contacts?: No Does the patient have difficulty concentrating, remembering, or making decisions?: No Does the patient have difficulty walking or climbing stairs?: No Does the patient have difficulty dressing or bathing?: No Does the patient have difficulty doing errands alone such as visiting a doctor's office or shopping?: No   Safety: reviewed ;  Patient wears a seat belt. Patient's home has smoke detectors. Patient practices appropriate gun safety Patient wears sunscreen with extended sun exposure. Dental Care: biannual cleanings, brushes and flosses daily. Ophthalmology/Optometry: Annual visit. Next appt 12/2022 Hearing loss: none Vision impairments: Glasses Patient is not afflicted from Stress Incontinence and Urge Incontinence   Current Outpatient Medications on File Prior to Visit  Medication Sig Dispense Refill   albuterol (VENTOLIN HFA) 108 (90 Base) MCG/ACT inhaler Inhale 2 puffs into the lungs every 6 (six) hours as needed for wheezing or shortness of breath. 8 g 0   amLODipine (NORVASC) 5 MG tablet TAKE 1 TABLET(5 MG) BY MOUTH DAILY 90 tablet 1   atorvastatin (LIPITOR) 40 MG tablet Take 1 tablet (40 mg total) by mouth daily. 90 tablet 0  estradiol (ESTRACE) 0.1 MG/GM vaginal cream SMARTSIG:Gram(s) Vaginal 3 Times a Week     hydroxychloroquine (PLAQUENIL) 200 MG tablet TAKE 1 TABLET(200 MG) BY MOUTH TWICE DAILY 180 tablet 0   omeprazole (PRILOSEC) 40 MG capsule Take 1 capsule (40 mg total) by mouth daily. 90 capsule 3   No current facility-administered medications on file prior to visit.    Social Hx   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  Tobacco Use   Smoking status: Former    Types: Cigarettes    Quit date: 11/09/1982     Years since quitting: 39.9   Smokeless tobacco: Never  Vaping Use   Vaping Use: Never used  Substance and Sexual Activity   Alcohol use: Not Currently   Drug use: Never   Sexual activity: Yes  Other Topics Concern   Not on file  Social History Narrative   Right handed   Lives with husband   Social Determinants of Health   Financial Resource Strain: Low Risk  (10/09/2022)   Overall Financial Resource Strain (CARDIA)    Difficulty of Paying Living Expenses: Not hard at all  Food Insecurity: No Food Insecurity (10/09/2022)   Hunger Vital Sign    Worried About Running Out of Food in the Last Year: Never true    Ran Out of Food in the Last Year: Never true  Transportation Needs: No Transportation Needs (10/09/2022)   PRAPARE - Administrator, Civil Service (Medical): No    Lack of Transportation (Non-Medical): No  Physical Activity: Insufficiently Active (10/09/2022)   Exercise Vital Sign    Days of Exercise per Week: 3 days    Minutes of Exercise per Session: 20 min  Stress: No Stress Concern Present (10/09/2022)   [employer] of Occupational Health - Occupational Stress Questionnaire    Feeling of Stress : Not at all  Social Connections: Moderately Integrated (10/09/2022)   Social Connection and Isolation Panel [NHANES]    Frequency of Communication with Friends and Family: More than three times a week    Frequency of Social Gatherings with Friends and Family: More than three times a week    Attends Religious Services: More than 4 times per year    Active Member of [employer] or Organizations: No    Attends Banker Meetings: Never    Marital Status: Married   Past Medical History:  Diagnosis Date   Attention deficit hyperactivity disorder, predominantly inattentive type    prefers no medicines.   Cough 11/07/2020   Dizziness and giddiness 01/14/2020   Encounter for immunization 08/28/2020   Essential (primary) hypertension 2016   Essential  hypertension, benign 08/28/2020   Fatigue 11/07/2020   Insomnia 01/14/2020   Left-sided headache 01/14/2020   Migraine with aura, intractable, without status migrainosus 1980   Mixed hyperlipidemia 08/28/2020   Other specified nontoxic goiter    Overweight    Paresthesias 01/14/2020   SLE (systemic lupus erythematosus related syndrome) (HCC) 01/14/2020   Systemic lupus erythematosus, organ or system involvement unspecified (HCC) 2006   Family History  Problem Relation Age of Onset   Congestive Heart Failure Father     Review of Systems  Constitutional:  Negative for chills, fatigue and  fever.  HENT:  Negative for congestion, ear pain, rhinorrhea and sore throat.   Respiratory:  Negative for cough and shortness of breath.   Cardiovascular:  Negative for chest pain.  Gastrointestinal:  Negative for abdominal pain, constipation, diarrhea, nausea and vomiting.  Genitourinary:  Negative for dysuria and urgency.  Musculoskeletal:  Negative for back pain and myalgias.  Neurological:  Negative for dizziness, weakness, light-headedness and headaches.  Psychiatric/Behavioral:  Negative for dysphoric mood. The patient is not nervous/anxious.      Objective:  BP 134/68   Pulse 65   Temp (!) 97.2 F (36.2 C)   Ht 5' 6.5" (1.689 m)   Wt 201 lb (91.2 kg)   SpO2 97%   BMI 31.96 kg/m      10/09/2022    8:06 AM 09/03/2022    8:50 AM 08/05/2022    1:43 PM  BP/Weight  Systolic BP 134 118 130  Diastolic BP 68 80 80  Wt. (Lbs) 201 200 197.8  BMI 31.96 kg/m2 31.8 kg/m2 31.45 kg/m2    Physical Exam Vitals reviewed.  Constitutional:      Appearance: Normal appearance.  HENT:     Head: Normocephalic.     Right Ear: Tympanic membrane normal.     Left Ear: Tympanic membrane normal.     Nose: Nose normal.     Mouth/Throat:     Mouth: Mucous membranes are moist.  Eyes:     Pupils: Pupils are equal, round, and reactive to light.  Cardiovascular:     Rate and Rhythm: Normal rate and regular  rhythm.  Pulmonary:     Effort: Pulmonary effort is normal.     Breath sounds: Normal breath sounds.  Abdominal:     General: Bowel sounds are normal.     Palpations: Abdomen is soft.  Musculoskeletal:        General: Normal range of motion.     Cervical back: Neck supple.  Skin:    General: Skin is warm and dry.     Capillary Refill: Capillary refill takes less than 2 seconds.  Neurological:     General: No focal deficit present.     Mental Status: She is alert and oriented to person, place, and time.  Psychiatric:        Mood and Affect: Mood normal.        Behavior: Behavior normal.     Lab Results  Component Value Date   WBC 4.1 09/03/2022   HGB 13.2 09/03/2022   HCT 41.0 09/03/2022   PLT 301 09/03/2022   GLUCOSE 82 09/03/2022   CHOL 188 09/03/2022   TRIG 83 09/03/2022   HDL 56 09/03/2022   LDLCALC 117 (H) 09/03/2022   ALT 31 09/03/2022   AST 26 09/03/2022   NA 140 09/03/2022   K 3.8 09/03/2022   CL 100 09/03/2022   CREATININE 0.84 09/03/2022   BUN 13 09/03/2022   CO2 24 09/03/2022   TSH 2.300 09/03/2022      Assessment & Plan:    1. Routine medical exam  2. Screening mammogram for breast cancer - MM DIGITAL SCREENING BILATERAL; Future     These are the goals we discussed:  Goals   Increase physical activity      This is a list of the screening recommended for you and due dates:  Health Maintenance  Topic Date Due   HIV Screening  Never done   Hepatitis C Screening: USPSTF Recommendation to screen - Ages 48-79 yo.  Never done   DTaP/Tdap/Td vaccine (1 - Tdap) Never done   Pap Smear  06/09/2014   Mammogram  04/15/2022   COVID-19 Vaccine (5 - 2023-24 season) 07/10/2022   Zoster (Shingles) Vaccine (2 of 2) 10/29/2022   Colon Cancer Screening  09/27/2030   Flu Shot  Completed   HPV Vaccine  Aged Out      AN INDIVIDUALIZED CARE PLAN: was established or reinforced today.   SELF MANAGEMENT: The patient and I together assessed ways to  personally work towards obtaining the recommended goals  Support needs The patient and/or family needs were assessed and services were offered and not necessary at this time.    Follow-up: 1-year  I, Janie Morning, NP, have reviewed all documentation for this visit. The documentation on 10/09/22 for the exam, diagnosis, procedures, and orders are all accurate and complete.   Signed, Flonnie Hailstone, DNP [facility] [phone]

## 2022-10-09 NOTE — Patient Instructions (Signed)
Preventive Care 8-62 Years Old, Female Preventive care refers to lifestyle choices and visits with your health care provider that can promote health and wellness. Preventive care visits are also called wellness exams. What can I expect for my preventive care visit? Counseling Your health care provider may ask you questions about your: Medical history, including: Past medical problems. Family medical history. Pregnancy history. Current health, including: Menstrual cycle. Method of birth control. Emotional well-being. Home life and relationship well-being. Sexual activity and sexual health. Lifestyle, including: Alcohol, nicotine or tobacco, and drug use. Access to firearms. Diet, exercise, and sleep habits. Work and work Statistician. Sunscreen use. Safety issues such as seatbelt and bike helmet use. Physical exam Your health care provider will check your: Height and weight. These may be used to calculate your BMI (body mass index). BMI is a measurement that tells if you are at a healthy weight. Waist circumference. This measures the distance around your waistline. This measurement also tells if you are at a healthy weight and may help predict your risk of certain diseases, such as type 2 diabetes and high blood pressure. Heart rate and blood pressure. Body temperature. Skin for abnormal spots. What immunizations do I need?  Vaccines are usually given at various ages, according to a schedule. Your health care provider will recommend vaccines for you based on your age, medical history, and lifestyle or other factors, such as travel or where you work. What tests do I need? Screening Your health care provider may recommend screening tests for certain conditions. This may include: Lipid and cholesterol levels. Diabetes screening. This is done by checking your blood sugar (glucose) after you have not eaten for a while (fasting). Pelvic exam and Pap test. Hepatitis B test. Hepatitis  C test. HIV (human immunodeficiency virus) test. STI (sexually transmitted infection) testing, if you are at risk. Lung cancer screening. Colorectal cancer screening. Mammogram. Talk with your health care provider about when you should start having regular mammograms. This may depend on whether you have a family history of breast cancer. BRCA-related cancer screening. This may be done if you have a family history of breast, ovarian, tubal, or peritoneal cancers. Bone density scan. This is done to screen for osteoporosis. Talk with your health care provider about your test results, treatment options, and if necessary, the need for more tests. Follow these instructions at home: Eating and drinking  Eat a diet that includes fresh fruits and vegetables, whole grains, lean protein, and low-fat dairy products. Take vitamin and mineral supplements as recommended by your health care provider. Do not drink alcohol if: Your health care provider tells you not to drink. You are pregnant, may be pregnant, or are planning to become pregnant. If you drink alcohol: Limit how much you have to 0-1 drink a day. Know how much alcohol is in your drink. In the [place] one drink equals one 12 oz bottle of beer (355 mL), one 5 oz glass of wine (148 mL), or one 1 oz glass of hard liquor (44 mL). Lifestyle Brush your teeth every morning and night with fluoride toothpaste. Floss one time each day. Exercise for at least 30 minutes 5 or more days each week. Do not use any products that contain nicotine or tobacco. These products include cigarettes, chewing tobacco, and vaping devices, such as e-cigarettes. If you need help quitting, ask your health care provider. Do not use drugs. If you are sexually active, practice safe sex. Use a condom or other form of protection to  prevent STIs. If you do not wish to become pregnant, use a form of birth control. If you plan to become pregnant, see your health care provider for a  prepregnancy visit. Take aspirin only as told by your health care provider. Make sure that you understand how much to take and what form to take. Work with your health care provider to find out whether it is safe and beneficial for you to take aspirin daily. Find healthy ways to manage stress, such as: Meditation, yoga, or listening to music. Journaling. Talking to a trusted person. Spending time with friends and family. Minimize exposure to UV radiation to reduce your risk of skin cancer. Safety Always wear your seat belt while driving or riding in a vehicle. Do not drive: If you have been drinking alcohol. Do not ride with someone who has been drinking. When you are tired or distracted. While texting. If you have been using any mind-altering substances or drugs. Wear a helmet and other protective equipment during sports activities. If you have firearms in your house, make sure you follow all gun safety procedures. Seek help if you have been physically or sexually abused. What's next? Visit your health care provider once a year for an annual wellness visit. Ask your health care provider how often you should have your eyes and teeth checked. Stay up to date on all vaccines. This information is not intended to replace advice given to you by your health care provider. Make sure you discuss any questions you have with your health care provider. Document Revised: 04/23/2021 Document Reviewed: 04/23/2021 Elsevier Patient Education  [place].

## 2022-10-19 ENCOUNTER — Other Ambulatory Visit: Payer: Self-pay

## 2022-10-19 DIAGNOSIS — Z1231 Encounter for screening mammogram for malignant neoplasm of breast: Secondary | ICD-10-CM

## 2022-11-04 ENCOUNTER — Ambulatory Visit: Payer: BC Managed Care – PPO | Admitting: Nurse Practitioner

## 2022-11-04 DIAGNOSIS — M545 Low back pain, unspecified: Secondary | ICD-10-CM | POA: Insufficient documentation

## 2022-11-04 HISTORY — DX: Low back pain, unspecified: M54.50

## 2022-11-04 NOTE — Progress Notes (Unsigned)
Acute Office Visit  Subjective:    Patient ID: Kathy Villa, female    DOB: 12/26/1960, 61 y.o.   MRN: [id number]  Chief Complaint  Patient presents with   Back Pain    HPI: Back Pain  She reports {chronicity:119221} back pain. There {injury:31712} an injury that may have caused the pain. The most recent episode started {onset initial:119223} and is {progression:119226}. The pain is located in the {location:31199}{radiating to:11559}. It is described as {quality:31200}, is {severity:119268} in intensity, occurring {frequency of symptoms:119294}. {Symptoms are worse in the:16448:::1}  {Aggravating factors:16449:::1} {Relieving factors:16449:::1}.  She has tried {treatments tried:173219}{improvement:119303}.   Associated symptoms: {Yes/No:20286} abdominal pain {Yes/No:20286} bowel incontinence  {Yes/No:20286} chest pain {Yes/No:20286} dysuria   {Yes/No:20286} fever {Yes/No:20286} headaches  {Yes/No:20286} joint pains {Yes/No:20286} pelvic pain  {Yes/No:20286} weakness in leg  {Yes/No:20286} tingling in lower extremities  {Yes/No:20286} urinary incontinence {Yes/No:20286} weight loss     Past Medical History:  Diagnosis Date   Attention deficit hyperactivity disorder, predominantly inattentive type    prefers no medicines.   Cough 11/07/2020   Dizziness and giddiness 01/14/2020   Encounter for immunization 08/28/2020   Essential (primary) hypertension 2016   Essential hypertension, benign 08/28/2020   Fatigue 11/07/2020   Insomnia 01/14/2020   Left-sided headache 01/14/2020   Migraine with aura, intractable, without status migrainosus 1980   Mixed hyperlipidemia 08/28/2020   Other specified nontoxic goiter    Overweight    Paresthesias 01/14/2020   SLE (systemic lupus erythematosus related syndrome) ([facility]) 01/14/2020   Systemic lupus erythematosus, organ or system involvement unspecified ([facility]) 2006    Past Surgical History:  Procedure Laterality Date   CESAREAN SECTION       Family History  Problem Relation Age of Onset   Congestive Heart Failure Father     Social History   Socioeconomic History   Marital status: Married    Spouse name: Not on file   Number of children: 3   Years of education: Not on file   Highest education level: Not on file  Occupational History   Occupation: Scientist, physiological- [employer]  Tobacco Use   Smoking status: Former    Types: Cigarettes    Quit date: 11/09/1982    Years since quitting: 40.0   Smokeless tobacco: Never  Vaping Use   Vaping Use: Never used  Substance and Sexual Activity   Alcohol use: Not Currently   Drug use: Never   Sexual activity: Yes  Other Topics Concern   Not on file  Social History Narrative   Right handed   Lives with husband   Social Determinants of Health   Financial Resource Strain: Low Risk  (10/09/2022)   Overall Financial Resource Strain (CARDIA)    Difficulty of Paying Living Expenses: Not hard at all  Food Insecurity: No Food Insecurity (10/09/2022)   Hunger Vital Sign    Worried About Running Out of Food in the Last Year: Never true    Ran Out of Food in the Last Year: Never true  Transportation Needs: No Transportation Needs (10/09/2022)   PRAPARE - Hydrologist (Medical): No    Lack of Transportation (Non-Medical): No  Physical Activity: Insufficiently Active (10/09/2022)   Exercise Vital Sign    Days of Exercise per Week: 3 days    Minutes of Exercise per Session: 20 min  Stress: No Stress Concern Present (10/09/2022)   [place]  Feeling of Stress : Not at all  Social Connections: Moderately Integrated (10/09/2022)   Social Connection and Isolation Panel [NHANES]    Frequency of Communication with Friends and Family: More than three times a week    Frequency of Social Gatherings with Friends and Family: More than three times a week    Attends Religious  Services: More than 4 times per year    Active Member of [employer] or Organizations: No    Attends Archivist Meetings: Never    Marital Status: Married  Human resources officer Violence: Not At Risk (10/09/2022)   Humiliation, Afraid, Rape, and Kick questionnaire    Fear of Current or Ex-Partner: No    Emotionally Abused: No    Physically Abused: No    Sexually Abused: No    Outpatient Medications Prior to Visit  Medication Sig Dispense Refill   albuterol (VENTOLIN HFA) 108 (90 Base) MCG/ACT inhaler Inhale 2 puffs into the lungs every 6 (six) hours as needed for wheezing or shortness of breath. 8 g 0   amLODipine (NORVASC) 5 MG tablet TAKE 1 TABLET(5 MG) BY MOUTH DAILY 90 tablet 1   atorvastatin (LIPITOR) 40 MG tablet Take 1 tablet (40 mg total) by mouth daily. 90 tablet 0   estradiol (ESTRACE) 0.1 MG/GM vaginal cream SMARTSIG:Gram(s) Vaginal 3 Times a Week     hydroxychloroquine (PLAQUENIL) 200 MG tablet TAKE 1 TABLET(200 MG) BY MOUTH TWICE DAILY 180 tablet 0   omeprazole (PRILOSEC) 40 MG capsule Take 1 capsule (40 mg total) by mouth daily. 90 capsule 3   No facility-administered medications prior to visit.    No Known Allergies  Review of Systems  Constitutional:  Negative for fatigue.  HENT:  Negative for congestion, ear pain and sore throat.   Respiratory:  Negative for cough and shortness of breath.   Cardiovascular:  Negative for chest pain.  Gastrointestinal:  Negative for abdominal pain, constipation, diarrhea, nausea and vomiting.  Genitourinary:  Negative for dysuria, frequency and urgency.  Musculoskeletal:  Positive for back pain. Negative for arthralgias and myalgias.  Neurological:  Negative for dizziness and headaches.  Psychiatric/Behavioral:  Negative for agitation and sleep disturbance. The patient is not nervous/anxious.        Objective:    Physical Exam Vitals reviewed.  Constitutional:      Appearance: Normal appearance.  Neck:     Vascular: No  carotid bruit.  Cardiovascular:     Rate and Rhythm: Normal rate and regular rhythm.  Pulmonary:     Effort: Pulmonary effort is normal.     Breath sounds: Normal breath sounds.  Abdominal:     General: Bowel sounds are normal.  Musculoskeletal:        General: Normal range of motion.     Cervical back: Normal range of motion.  Skin:    General: Skin is warm.  Neurological:     Mental Status: She is alert.  Psychiatric:        Mood and Affect: Mood normal.        Behavior: Behavior normal.     There were no vitals taken for this visit. Wt Readings from Last 3 Encounters:  10/09/22 201 lb (91.2 kg)  09/03/22 200 lb (90.7 kg)  08/05/22 197 lb 12.8 oz (89.7 kg)    Health Maintenance Due  Topic Date Due   HIV Screening  Never done   Hepatitis C Screening  Never done   DTaP/Tdap/Td (1 - Tdap) Never done   PAP  SMEAR-Modifier  06/09/2014   COVID-19 Vaccine (5 - 2023-24 season) 07/10/2022   Zoster Vaccines- Shingrix (2 of 2) 10/29/2022    There are no preventive care reminders to display for this patient.   Lab Results  Component Value Date   TSH 2.300 09/03/2022   Lab Results  Component Value Date   WBC 4.1 09/03/2022   HGB 13.2 09/03/2022   HCT 41.0 09/03/2022   MCV 95 09/03/2022   PLT 301 09/03/2022   Lab Results  Component Value Date   NA 140 09/03/2022   K 3.8 09/03/2022   CO2 24 09/03/2022   GLUCOSE 82 09/03/2022   BUN 13 09/03/2022   CREATININE 0.84 09/03/2022   BILITOT 0.6 09/03/2022   ALKPHOS 99 09/03/2022   AST 26 09/03/2022   ALT 31 09/03/2022   PROT 7.7 09/03/2022   ALBUMIN 4.9 09/03/2022   CALCIUM 9.8 09/03/2022   EGFR 79 09/03/2022   Lab Results  Component Value Date   CHOL 188 09/03/2022   Lab Results  Component Value Date   HDL 56 09/03/2022   Lab Results  Component Value Date   LDLCALC 117 (H) 09/03/2022   Lab Results  Component Value Date   TRIG 83 09/03/2022   Lab Results  Component Value Date   CHOLHDL 3.4 09/03/2022    No results found for: "HGBA1C"     Assessment & Plan:   Problem List Items Addressed This Visit   None  No orders of the defined types were placed in this encounter.   No orders of the defined types were placed in this encounter.    Follow-up: No follow-ups on file.  An After Visit Summary was printed and given to the patient.  Rip Harbour, NP [place] [phone]

## 2022-11-05 ENCOUNTER — Other Ambulatory Visit: Payer: Self-pay

## 2022-11-05 ENCOUNTER — Ambulatory Visit: Payer: BC Managed Care – PPO | Admitting: Nurse Practitioner

## 2022-11-05 ENCOUNTER — Encounter: Payer: Self-pay | Admitting: Nurse Practitioner

## 2022-11-05 VITALS — BP 120/80 | HR 80 | Temp 95.6°F | Ht 66.5 in | Wt 200.0 lb

## 2022-11-05 DIAGNOSIS — M545 Low back pain, unspecified: Secondary | ICD-10-CM | POA: Diagnosis not present

## 2022-11-05 DIAGNOSIS — M4306 Spondylolysis, lumbar region: Secondary | ICD-10-CM

## 2022-11-05 DIAGNOSIS — M5386 Other specified dorsopathies, lumbar region: Secondary | ICD-10-CM

## 2022-11-05 NOTE — Patient Instructions (Addendum)
Continue Flexeril as prescribed Alternate heat and ice to low back as needed Perform back exercises daily Apply Voltaren gel to right lower back up to 4 times daily Follow-up as neededp   Low Back Sprain or Strain Rehab Ask your health care provider which exercises are safe for you. Do exercises exactly as told by your health care provider and adjust them as directed. It is normal to feel mild stretching, pulling, tightness, or discomfort as you do these exercises. Stop right away if you feel sudden pain or your pain gets worse. Do not begin these exercises until told by your health care provider. Stretching and range-of-motion exercises These exercises warm up your muscles and joints and improve the movement and flexibility of your back. These exercises also help to relieve pain, numbness, and tingling. Lumbar rotation  Lie on your back on a firm bed or the floor with your knees bent. Straighten your arms out to your sides so each arm forms a 90-degree angle (right angle) with a side of your body. Slowly move (rotate) both of your knees to one side of your body until you feel a stretch in your lower back (lumbar). Try not to let your shoulders lift off the floor. Hold this position for __________ seconds. Tense your abdominal muscles and slowly move your knees back to the starting position. Repeat this exercise on the other side of your body. Repeat __________ times. Complete this exercise __________ times a day. Single knee to chest  Lie on your back on a firm bed or the floor with both legs straight. Bend one of your knees. Use your hands to move your knee up toward your chest until you feel a gentle stretch in your lower back and buttock. Hold your leg in this position by holding on to the front of your knee. Keep your other leg as straight as possible. Hold this position for __________ seconds. Slowly return to the starting position. Repeat with your other leg. Repeat __________  times. Complete this exercise __________ times a day. Prone extension on elbows  Lie on your abdomen on a firm bed or the floor (prone position). Prop yourself up on your elbows. Use your arms to help lift your chest up until you feel a gentle stretch in your abdomen and your lower back. This will place some of your body weight on your elbows. If this is uncomfortable, try stacking pillows under your chest. Your hips should stay down, against the surface that you are lying on. Keep your hip and back muscles relaxed. Hold this position for __________ seconds. Slowly relax your upper body and return to the starting position. Repeat __________ times. Complete this exercise __________ times a day. Strengthening exercises These exercises build strength and endurance in your back. Endurance is the ability to use your muscles for a long time, even after they get tired. Pelvic tilt This exercise strengthens the muscles that lie deep in the abdomen. Lie on your back on a firm bed or the floor with your legs extended. Bend your knees so they are pointing toward the ceiling and your feet are flat on the floor. Tighten your lower abdominal muscles to press your lower back against the floor. This motion will tilt your pelvis so your tailbone points up toward the ceiling instead of pointing to your feet or the floor. To help with this exercise, you may place a small towel under your lower back and try to push your back into the towel. Hold this  position for __________ seconds. Let your muscles relax completely before you repeat this exercise. Repeat __________ times. Complete this exercise __________ times a day. Alternating arm and leg raises  Get on your hands and knees on a firm surface. If you are on a hard floor, you may want to use padding, such as an exercise mat, to cushion your knees. Line up your arms and legs. Your hands should be directly below your shoulders, and your knees should be directly  below your hips. Lift your left leg behind you. At the same time, raise your right arm and straighten it in front of you. Do not lift your leg higher than your hip. Do not lift your arm higher than your shoulder. Keep your abdominal and back muscles tight. Keep your hips facing the ground. Do not arch your back. Keep your balance carefully, and do not hold your breath. Hold this position for __________ seconds. Slowly return to the starting position. Repeat with your right leg and your left arm. Repeat __________ times. Complete this exercise __________ times a day. Abdominal set with straight leg raise  Lie on your back on a firm bed or the floor. Bend one of your knees and keep your other leg straight. Tense your abdominal muscles and lift your straight leg up, 4-6 inches (10-15 cm) off the ground. Keep your abdominal muscles tight and hold this position for __________ seconds. Do not hold your breath. Do not arch your back. Keep it flat against the ground. Keep your abdominal muscles tense as you slowly lower your leg back to the starting position. Repeat with your other leg. Repeat __________ times. Complete this exercise __________ times a day. Single leg lower with bent knees Lie on your back on a firm bed or the floor. Tense your abdominal muscles and lift your feet off the floor, one foot at a time, so your knees and hips are bent in 90-degree angles (right angles). Your knees should be over your hips and your lower legs should be parallel to the floor. Keeping your abdominal muscles tense and your knee bent, slowly lower one of your legs so your toe touches the ground. Lift your leg back up to return to the starting position. Do not hold your breath. Do not let your back arch. Keep your back flat against the ground. Repeat with your other leg. Repeat __________ times. Complete this exercise __________ times a day. Posture and body mechanics Good posture and healthy body  mechanics can help to relieve stress in your body's tissues and joints. Body mechanics refers to the movements and positions of your body while you do your daily activities. Posture is part of body mechanics. Good posture means: Your spine is in its natural S-curve position (neutral). Your shoulders are pulled back slightly. Your head is not tipped forward (neutral). Follow these guidelines to improve your posture and body mechanics in your everyday activities. Standing  When standing, keep your spine neutral and your feet about hip-width apart. Keep a slight bend in your knees. Your ears, shoulders, and hips should line up. When you do a task in which you stand in one place for a long time, place one foot up on a stable object that is 2-4 inches (5-10 cm) high, such as a footstool. This helps keep your spine neutral. Sitting  When sitting, keep your spine neutral and keep your feet flat on the floor. Use a footrest, if necessary, and keep your thighs parallel to the floor. Avoid  rounding your shoulders, and avoid tilting your head forward. When working at a desk or a computer, keep your desk at a height where your hands are slightly lower than your elbows. Slide your chair under your desk so you are close enough to maintain good posture. When working at a computer, place your monitor at a height where you are looking straight ahead and you do not have to tilt your head forward or downward to look at the screen. Resting When lying down and resting, avoid positions that are most painful for you. If you have pain with activities such as sitting, bending, stooping, or squatting, lie in a position in which your body does not bend very much. For example, avoid curling up on your side with your arms and knees near your chest (fetal position). If you have pain with activities such as standing for a long time or reaching with your arms, lie with your spine in a neutral position and bend your knees slightly.  Try the following positions: Lying on your side with a pillow between your knees. Lying on your back with a pillow under your knees. Lifting  When lifting objects, keep your feet at least shoulder-width apart and tighten your abdominal muscles. Bend your knees and hips and keep your spine neutral. It is important to lift using the strength of your legs, not your back. Do not lock your knees straight out. Always ask for help to lift heavy or awkward objects. This information is not intended to replace advice given to you by your health care provider. Make sure you discuss any questions you have with your health care provider. Document Revised: 01/13/2021 Document Reviewed: 01/13/2021 Elsevier Patient Education  2023 [employer]  Chronic Back Pain When back pain lasts longer than 3 months, it is called chronic back pain. Pain may get worse at certain times (flare-ups). There are things you can do at home to manage your pain. Follow these instructions at home: Pay attention to any changes in your symptoms. Take these actions to help with your pain: Managing pain and stiffness     If told, put ice on the painful area. Your doctor may tell you to use ice for 24-48 hours after the flare-up starts. To do this: Put ice in a plastic bag. Place a towel between your skin and the bag. Leave the ice on for 20 minutes, 2-3 times a day. If told, put heat on the painful area. Do this as often as told by your doctor. Use the heat source that your doctor recommends, such as a moist heat pack or a heating pad. Place a towel between your skin and the heat source. Leave the heat on for 20-30 minutes. Take off the heat if your skin turns bright red. This is especially important if you are unable to feel pain, heat, or cold. You may have a greater risk of getting burned. Soak in a warm bath. This can help relieve pain. Activity  Avoid bending and other activities that make pain worse. When  standing: Keep your upper back and neck straight. Keep your shoulders pulled back. Avoid slouching. When sitting: Keep your back straight. Relax your shoulders. Do not round your shoulders or pull them backward. Do not sit or stand in one place for long periods of time. Take short rest breaks during the day. Lying down or standing is usually better than sitting. Resting can help relieve pain. When sitting or lying down for a long time, do some  mild activity or stretching. This will help to prevent stiffness and pain. Get regular exercise. Ask your doctor what activities are safe for you. Do not lift anything that is heavier than 10 lb (4.5 kg) or the limit that you are told, until your doctor says that it is safe. To prevent injury when you lift things: Bend your knees. Keep the weight close to your body. Avoid twisting. Sleep on a firm mattress. Try lying on your side with your knees slightly bent. If you lie on your back, put a pillow under your knees. Medicines Treatment may include medicines for pain and swelling taken by mouth or put on the skin, prescription pain medicine, or muscle relaxants. Take over-the-counter and prescription medicines only as told by your doctor. Ask your doctor if the medicine prescribed to you: Requires you to avoid driving or using machinery. Can cause trouble pooping (constipation). You may need to take these actions to prevent or treat trouble pooping: Drink enough fluid to keep your pee (urine) pale yellow. Take over-the-counter or prescription medicines. Eat foods that are high in fiber. These include beans, whole grains, and fresh fruits and vegetables. Limit foods that are high in fat and sugars. These include fried or sweet foods. General instructions Do not use any products that contain nicotine or tobacco, such as cigarettes, e-cigarettes, and chewing tobacco. If you need help quitting, ask your doctor. Keep all follow-up visits as told by your  doctor. This is important. Contact a doctor if: Your pain does not get better with rest or medicine. Your pain gets worse, or you have new pain. You have a high fever. You lose weight very quickly. You have trouble doing your normal activities. Get help right away if: One or both of your legs or feet feel weak. One or both of your legs or feet lose feeling (have numbness). You have trouble controlling when you poop (have a bowel movement) or pee (urinate). You have bad back pain and: You feel like you may vomit (nauseous), or you vomit. You have pain in your belly (abdomen). You have shortness of breath. You faint. Summary When back pain lasts longer than 3 months, it is called chronic back pain. Pain may get worse at certain times (flare-ups). Use ice and heat as told by your doctor. Your doctor may tell you to use ice after flare-ups. This information is not intended to replace advice given to you by your health care provider. Make sure you discuss any questions you have with your health care provider. Document Revised: 12/06/2019 Document Reviewed: 12/06/2019 Elsevier Patient Education  2023 [employer].

## 2022-11-05 NOTE — Progress Notes (Signed)
Acute Office Visit  Subjective:    Patient ID: Kathy Villa, female    DOB: [birth year], 61 y.o.   MRN: [id number]  Chief Complaint  Patient presents with   Back Pain    lower    HPI: Back Pain  She reports chronic back pain. There was not an injury that may have caused the pain. The most recent episode started about a week ago and is worsening. The pain is located in the right sacroiliac area radiating to the right foot. It is described as throbbing, is 2/10 in intensity, occurring intermittently. Symptoms are worse in the: morning  Aggravating factors: at night when she sleep  Relieving factors: hot shower and moving around .  She has tried prescription muscle relaxants with mild relief.  Pt recently had lumbar x-rays at [place] on 11/03/22 that revealed lumbar spondylosis. Associated symptoms: No abdominal pain No bowel incontinence  No chest pain No dysuria   No fever No headaches  Yes joint pains No pelvic pain  No weakness in leg  No tingling in lower extremities  No urinary incontinence No weight loss    Past Medical History:  Diagnosis Date   Attention deficit hyperactivity disorder, predominantly inattentive type    prefers no medicines.   Cough 11/07/2020   Dizziness and giddiness 01/14/2020   Encounter for immunization 08/28/2020   Essential (primary) hypertension 2016   Essential hypertension, benign 08/28/2020   Fatigue 11/07/2020   Insomnia 01/14/2020   Left-sided headache 01/14/2020   Migraine with aura, intractable, without status migrainosus 1980   Mixed hyperlipidemia 08/28/2020   Other specified nontoxic goiter    Overweight    Paresthesias 01/14/2020   SLE (systemic lupus erythematosus related syndrome) ([place]) 01/14/2020   Systemic lupus erythematosus, organ or system involvement unspecified ([place]) 2006    Past Surgical History:  Procedure Laterality Date   CESAREAN SECTION      Family History  Problem Relation Age of Onset   Congestive  Heart Failure Father     Social History   Socioeconomic History   Marital status: Married    Spouse name: Not on file   Number of children: 3   Years of education: Not on file   Highest education level: Not on file  Occupational History   Occupation: Scientist, physiological- [employer]  Tobacco Use   Smoking status: Former    Types: Cigarettes    Quit date: 11/09/1982    Years since quitting: 40.0   Smokeless tobacco: Never  Vaping Use   Vaping Use: Never used  Substance and Sexual Activity   Alcohol use: Not Currently   Drug use: Never   Sexual activity: Yes  Other Topics Concern   Not on file  Social History Narrative   Right handed   Lives with husband   Social Determinants of Health   Financial Resource Strain: Low Risk  (10/09/2022)   Overall Financial Resource Strain (CARDIA)    Difficulty of Paying Living Expenses: Not hard at all  Food Insecurity: No Food Insecurity (10/09/2022)   Hunger Vital Sign    Worried About Running Out of Food in the Last Year: Never true    Ran Out of Food in the Last Year: Never true  Transportation Needs: No Transportation Needs (10/09/2022)   PRAPARE - Hydrologist (Medical): No    Lack of Transportation (Non-Medical): No  Physical Activity: Insufficiently Active (10/09/2022)   Exercise Vital Sign  Days of Exercise per Week: 3 days    Minutes of Exercise per Session: 20 min  Stress: No Stress Concern Present (10/09/2022)   [place]    Feeling of Stress : Not at all  Social Connections: Moderately Integrated (10/09/2022)   Social Connection and Isolation Panel [NHANES]    Frequency of Communication with Friends and Family: More than three times a week    Frequency of Social Gatherings with Friends and Family: More than three times a week    Attends Religious Services: More than 4 times per year    Active Member of [employer] or  Organizations: No    Attends Archivist Meetings: Never    Marital Status: Married  Human resources officer Violence: Not At Risk (10/09/2022)   Humiliation, Afraid, Rape, and Kick questionnaire    Fear of Current or Ex-Partner: No    Emotionally Abused: No    Physically Abused: No    Sexually Abused: No    Outpatient Medications Prior to Visit  Medication Sig Dispense Refill   albuterol (VENTOLIN HFA) 108 (90 Base) MCG/ACT inhaler Inhale 2 puffs into the lungs every 6 (six) hours as needed for wheezing or shortness of breath. 8 g 0   amLODipine (NORVASC) 5 MG tablet TAKE 1 TABLET(5 MG) BY MOUTH DAILY 90 tablet 1   atorvastatin (LIPITOR) 40 MG tablet Take 1 tablet (40 mg total) by mouth daily. 90 tablet 0   estradiol (ESTRACE) 0.1 MG/GM vaginal cream SMARTSIG:Gram(s) Vaginal 3 Times a Week     hydroxychloroquine (PLAQUENIL) 200 MG tablet TAKE 1 TABLET(200 MG) BY MOUTH TWICE DAILY 180 tablet 0   omeprazole (PRILOSEC) 40 MG capsule Take 1 capsule (40 mg total) by mouth daily. 90 capsule 3   No facility-administered medications prior to visit.    No Known Allergies  Review of Systems  Constitutional:  Negative for fatigue.  HENT:  Negative for congestion, ear pain and sore throat.   Respiratory:  Negative for cough and shortness of breath.   Cardiovascular:  Negative for chest pain.  Gastrointestinal:  Negative for abdominal pain, constipation, diarrhea, nausea and vomiting.  Genitourinary:  Negative for dysuria, frequency and urgency.  Musculoskeletal:  Positive for back pain (lower, right). Negative for arthralgias and myalgias.  Neurological:  Negative for dizziness and headaches.  Psychiatric/Behavioral:  Negative for agitation and sleep disturbance. The patient is not nervous/anxious.        Objective:    Physical Exam Vitals reviewed.  Constitutional:      Appearance: Normal appearance. She is normal weight.  HENT:     Right Ear: Tympanic membrane normal.     Left  Ear: Tympanic membrane normal.     Nose: No congestion or rhinorrhea.     Mouth/Throat:     Pharynx: No oropharyngeal exudate or posterior oropharyngeal erythema.  Cardiovascular:     Rate and Rhythm: Normal rate and regular rhythm.     Heart sounds: Normal heart sounds.  Pulmonary:     Effort: Pulmonary effort is normal.     Breath sounds: Normal breath sounds.  Abdominal:     General: Bowel sounds are normal.     Palpations: Abdomen is soft.     Tenderness: There is no abdominal tenderness.  Musculoskeletal:     Cervical back: Normal.     Thoracic back: Normal.     Lumbar back: Spasms and tenderness present. No deformity or bony tenderness. Normal range of motion.  Negative right straight leg raise test and negative left straight leg raise test.  Lymphadenopathy:     Cervical: No cervical adenopathy.  Neurological:     Mental Status: She is alert and oriented to person, place, and time.  Psychiatric:        Mood and Affect: Mood normal.        Behavior: Behavior normal.     BP 120/80 (BP Location: Left Arm, Patient Position: Sitting, Cuff Size: Large)   Pulse 80   Temp (!) 95.6 F (35.3 C) (Temporal)   Ht 5' 6.5" (1.689 m)   Wt 200 lb (90.7 kg)   SpO2 98%   BMI 31.80 kg/m   Wt Readings from Last 3 Encounters:  11/05/22 200 lb (90.7 kg)  10/09/22 201 lb (91.2 kg)  09/03/22 200 lb (90.7 kg)    Health Maintenance Due  Topic Date Due   HIV Screening  Never done   Hepatitis C Screening  Never done   DTaP/Tdap/Td (1 - Tdap) Never done   PAP SMEAR-Modifier  06/09/2014   COVID-19 Vaccine (5 - 2023-24 season) 07/10/2022   Zoster Vaccines- Shingrix (2 of 2) 10/29/2022    Lab Results  Component Value Date   TSH 2.300 09/03/2022   Lab Results  Component Value Date   WBC 4.1 09/03/2022   HGB 13.2 09/03/2022   HCT 41.0 09/03/2022   MCV 95 09/03/2022   PLT 301 09/03/2022   Lab Results  Component Value Date   NA 140 09/03/2022   K 3.8 09/03/2022   CO2 24  09/03/2022   GLUCOSE 82 09/03/2022   BUN 13 09/03/2022   CREATININE 0.84 09/03/2022   BILITOT 0.6 09/03/2022   ALKPHOS 99 09/03/2022   AST 26 09/03/2022   ALT 31 09/03/2022   PROT 7.7 09/03/2022   ALBUMIN 4.9 09/03/2022   CALCIUM 9.8 09/03/2022   EGFR 79 09/03/2022   Lab Results  Component Value Date   CHOL 188 09/03/2022   Lab Results  Component Value Date   HDL 56 09/03/2022   Lab Results  Component Value Date   LDLCALC 117 (H) 09/03/2022   Lab Results  Component Value Date   TRIG 83 09/03/2022   Lab Results  Component Value Date   CHOLHDL 3.4 09/03/2022        Assessment & Plan:   1. Lumbar spondylolysis-improved -continue Tylenol/Ibuprofen as needed for pain -continue Flexeril as prescribed for back muscle spasms   2. Lumbar pain -Alternate heat and ice to right lower back -perform back stretching exercises daily   Continue Flexeril as prescribed Alternate heat and ice to low back as needed Perform back exercises daily Apply Voltaren gel to right lower back up to 4 times daily Follow-up as needed     Follow-up: PRN  An After Visit Summary was printed and given to the patient.  I, Rip Harbour, NP, have reviewed all documentation for this visit. The documentation on 11/05/22 for the exam, diagnosis, procedures, and orders are all accurate and complete.    Signed, Rip Harbour, NP [facility] [phone]

## 2022-12-21 ENCOUNTER — Other Ambulatory Visit: Payer: Self-pay

## 2022-12-21 ENCOUNTER — Other Ambulatory Visit: Payer: Self-pay | Admitting: Family Medicine

## 2022-12-21 MED ORDER — ATORVASTATIN CALCIUM 40 MG PO TABS: 40.0000 mg | ORAL_TABLET | Freq: Every day | ORAL | 0 refills | Status: DC

## 2023-02-04 LAB — HM PAP SMEAR: HM Pap smear: NEGATIVE

## 2023-02-04 LAB — RESULTS CONSOLE HPV: CHL HPV: NEGATIVE

## 2023-02-08 NOTE — Progress Notes (Signed)
Acute Office Visit  Subjective:    Patient ID: Kathy Villa, female    DOB: 1961-09-17, 62 y.o.   MRN: [id number]  Chief Complaint  Patient presents with   Skin Tag    HPI: Patient is in today for removal of skin tags. One was under her left armpit and it was causing discomfort, but it came off on its own.   Patient has questions about wt loss medications. I discussed insurance restrictions.   Past Medical History:  Diagnosis Date   Attention deficit hyperactivity disorder, predominantly inattentive type    prefers no medicines.   Cough 11/07/2020   Dizziness and giddiness 01/14/2020   Encounter for immunization 08/28/2020   Essential (primary) hypertension 2016   Essential hypertension, benign 08/28/2020   Fatigue 11/07/2020   Insomnia 01/14/2020   Left-sided headache 01/14/2020   Migraine with aura, intractable, without status migrainosus 1980   Mixed hyperlipidemia 08/28/2020   Other specified nontoxic goiter    Overweight    Paresthesias 01/14/2020   SLE (systemic lupus erythematosus related syndrome) (HCC) 01/14/2020   Systemic lupus erythematosus, organ or system involvement unspecified (HCC) 2006    Past Surgical History:  Procedure Laterality Date   CESAREAN SECTION      Family History  Problem Relation Age of Onset   Congestive Heart Failure Father     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  Tobacco Use   Smoking status: Former    Types: Cigarettes    Quit date: 11/09/1982    Years since quitting: 40.2   Smokeless tobacco: Never  Vaping Use   Vaping Use: Never used  Substance and Sexual Activity   Alcohol use: Not Currently   Drug use: Never   Sexual activity: Yes  Other Topics Concern   Not on file  Social  History Narrative   Right handed   Lives with husband   Social Determinants of Health   Financial Resource Strain: Low Risk  (10/09/2022)   Overall Financial Resource Strain (CARDIA)    Difficulty of Paying Living Expenses: Not hard at all  Food Insecurity: No Food Insecurity (10/09/2022)   Hunger Vital Sign    Worried About Running Out of Food in the Last Year: Never true    Ran Out of Food in the Last Year: Never true  Transportation Needs: No Transportation Needs (10/09/2022)   PRAPARE - Administrator, Civil Service (Medical): No    Lack of Transportation (Non-Medical): No  Physical Activity: Insufficiently Active (10/09/2022)   Exercise Vital Sign    Days of Exercise per Week: 3 days    Minutes of Exercise per Session: 20 min  Stress: No Stress Concern Present (10/09/2022)   [employer] of Occupational Health - Occupational Stress Questionnaire    Feeling of Stress : Not at all  Social Connections: Moderately Integrated (10/09/2022)   Social Connection and Isolation Panel [NHANES]    Frequency of Communication with Friends and Family: More than three times a week    Frequency of Social Gatherings with Friends and Family: More than three times a week    Attends Religious Services: More than 4 times per year    Active Member of Clubs or  Organizations: No    Attends Banker Meetings: Never    Marital Status: Married  Catering manager Violence: Not At Risk (10/09/2022)   Humiliation, Afraid, Rape, and Kick questionnaire    Fear of Current or Ex-Partner: No    Emotionally Abused: No    Physically Abused: No    Sexually Abused: No    Outpatient Medications Prior to Visit  Medication Sig Dispense Refill   albuterol (VENTOLIN HFA) 108 (90 Base) MCG/ACT inhaler Inhale 2 puffs into the lungs every 6 (six) hours as needed for wheezing or shortness of breath. 8 g 0   amLODipine (NORVASC) 5 MG tablet TAKE 1 TABLET(5 MG) BY MOUTH DAILY 90 tablet 0    atorvastatin (LIPITOR) 40 MG tablet Take 1 tablet (40 mg total) by mouth daily. 90 tablet 0   cyclobenzaprine (FLEXERIL) 5 MG tablet Take 5 mg by mouth 3 (three) times daily as needed for muscle spasms.     estradiol (ESTRACE) 0.1 MG/GM vaginal cream SMARTSIG:Gram(s) Vaginal 3 Times a Week     hydroxychloroquine (PLAQUENIL) 200 MG tablet TAKE 1 TABLET(200 MG) BY MOUTH TWICE DAILY 180 tablet 0   omeprazole (PRILOSEC) 40 MG capsule Take 1 capsule (40 mg total) by mouth daily. 90 capsule 3   No facility-administered medications prior to visit.    No Known Allergies  Review of Systems     Objective:        02/09/2023    7:57 AM 11/05/2022    8:48 AM 10/09/2022    8:06 AM  Vitals with BMI  Height 5' 6.5" 5' 6.5" 5' 6.5"  Weight 200 lbs 200 lbs 201 lbs  BMI 31.8 31.8 31.96  Systolic 120 120 628  Diastolic 82 80 68  Pulse 78 80 65    No data found.   Physical Exam Vitals reviewed.  Constitutional:      Appearance: Normal appearance. She is obese.  Skin:    Findings: Lesion (skin tag on left side of abdomen and skin tag on mid abdomen.) present.  Neurological:     Mental Status: She is alert.     Health Maintenance Due  Topic Date Due   HIV Screening  Never done   Hepatitis C Screening  Never done   DTaP/Tdap/Td (1 - Tdap) Never done   PAP SMEAR-Modifier  06/09/2014   COVID-19 Vaccine (5 - 2023-24 season) 07/10/2022   Zoster Vaccines- Shingrix (2 of 2) 10/29/2022    There are no preventive care reminders to display for this patient.   Lab Results  Component Value Date   TSH 2.300 09/03/2022   Lab Results  Component Value Date   WBC 4.1 09/03/2022   HGB 13.2 09/03/2022   HCT 41.0 09/03/2022   MCV 95 09/03/2022   PLT 301 09/03/2022   Lab Results  Component Value Date   NA 140 09/03/2022   K 3.8 09/03/2022   CO2 24 09/03/2022   GLUCOSE 82 09/03/2022   BUN 13 09/03/2022   CREATININE 0.84 09/03/2022   BILITOT 0.6 09/03/2022   ALKPHOS 99 09/03/2022   AST  26 09/03/2022   ALT 31 09/03/2022   PROT 7.7 09/03/2022   ALBUMIN 4.9 09/03/2022   CALCIUM 9.8 09/03/2022   EGFR 79 09/03/2022   Lab Results  Component Value Date   CHOL 188 09/03/2022   Lab Results  Component Value Date   HDL 56 09/03/2022   Lab Results  Component Value Date   LDLCALC 117 (H) 09/03/2022  Lab Results  Component Value Date   TRIG 83 09/03/2022   Lab Results  Component Value Date   CHOLHDL 3.4 09/03/2022   No results found for: "HGBA1C"     Assessment & Plan:  Skin tag Assessment & Plan: Procedure: removal of skin tags.  Anesthesia: ethyl chloride.  Lesions removed with scissors. No complications.  Bandaids applied.    Class 1 obesity due to excess calories with serious comorbidity and body mass index (BMI) of 31.0 to 31.9 in adult Assessment & Plan: Weight loss medicines to consider: Phentermine topamax Qsymia (phentermine and topamax combined.) Wegovy (injectable) Zepbound (injectable) Contrave (wellbutrin/naloxone)  Patient to review medicines. Recommend continue to work on eating healthy diet and exercise.       No orders of the defined types were placed in this encounter.   No orders of the defined types were placed in this encounter.    Follow-up: Return if symptoms worsen or fail to improve.  An After Visit Summary was printed and given to the patient.  Blane Ohara, MD [facility] [phone]

## 2023-02-09 ENCOUNTER — Ambulatory Visit (INDEPENDENT_AMBULATORY_CARE_PROVIDER_SITE_OTHER): Payer: BC Managed Care – PPO | Admitting: Family Medicine

## 2023-02-09 VITALS — BP 120/82 | HR 78 | Temp 97.1°F | Ht 66.5 in | Wt 200.0 lb

## 2023-02-09 DIAGNOSIS — E6609 Other obesity due to excess calories: Secondary | ICD-10-CM | POA: Diagnosis not present

## 2023-02-09 DIAGNOSIS — L918 Other hypertrophic disorders of the skin: Secondary | ICD-10-CM | POA: Diagnosis not present

## 2023-02-09 DIAGNOSIS — Z6831 Body mass index (BMI) 31.0-31.9, adult: Secondary | ICD-10-CM

## 2023-02-09 NOTE — Patient Instructions (Addendum)
Weight loss medicines to consider: Phentermine topamax Qsymia (phentermine and topamax combined.) Wegovy (injectable) Zepbound (injectable) Contrave (wellbutrin/naloxone)

## 2023-02-13 ENCOUNTER — Encounter: Payer: Self-pay | Admitting: Family Medicine

## 2023-02-13 DIAGNOSIS — E6609 Other obesity due to excess calories: Secondary | ICD-10-CM

## 2023-02-13 DIAGNOSIS — E66811 Other obesity due to excess calories: Secondary | ICD-10-CM

## 2023-02-13 DIAGNOSIS — L918 Other hypertrophic disorders of the skin: Secondary | ICD-10-CM

## 2023-02-13 HISTORY — DX: Other obesity due to excess calories: E66.09

## 2023-02-13 HISTORY — DX: Other obesity due to excess calories: E66.811

## 2023-02-13 HISTORY — DX: Other hypertrophic disorders of the skin: L91.8

## 2023-02-13 NOTE — Assessment & Plan Note (Signed)
Weight loss medicines to consider: Phentermine topamax Qsymia (phentermine and topamax combined.) Wegovy (injectable) Zepbound (injectable) Contrave (wellbutrin/naloxone)  Patient to review medicines. Recommend continue to work on eating healthy diet and exercise.

## 2023-02-13 NOTE — Assessment & Plan Note (Addendum)
Procedure: removal of skin tags.  Anesthesia: ethyl chloride.  Lesions removed with scissors. No complications.  Bandaids applied.

## 2023-02-25 ENCOUNTER — Ambulatory Visit: Payer: BC Managed Care – PPO | Admitting: Family Medicine

## 2023-02-25 ENCOUNTER — Telehealth: Payer: Self-pay | Admitting: Family Medicine

## 2023-02-25 VITALS — BP 130/74 | HR 67 | Temp 96.9°F | Ht 66.5 in | Wt 197.0 lb

## 2023-02-25 DIAGNOSIS — N3000 Acute cystitis without hematuria: Secondary | ICD-10-CM

## 2023-02-25 LAB — POCT URINALYSIS DIP (CLINITEK)
Bilirubin, UA: NEGATIVE
Blood, UA: NEGATIVE
Glucose, UA: NEGATIVE mg/dL
Ketones, POC UA: NEGATIVE mg/dL
Nitrite, UA: NEGATIVE
POC PROTEIN,UA: NEGATIVE
Spec Grav, UA: 1.015 (ref 1.010–1.025)
Urobilinogen, UA: 0.2 E.U./dL
pH, UA: 7 (ref 5.0–8.0)

## 2023-02-25 MED ORDER — NITROFURANTOIN MONOHYD MACRO 100 MG PO CAPS: 100.0000 mg | ORAL_CAPSULE | Freq: Two times a day (BID) | ORAL | 0 refills | Status: DC

## 2023-02-25 NOTE — Progress Notes (Unsigned)
Acute Office Visit  Subjective:    Patient ID: Kathy Villa, female    DOB: 11/07/1961, 62 y.o.   MRN: [id number]  Chief Complaint  Patient presents with   Urinary Tract Infection    HPI: Patient is in today for uti symptoms complains of lower back pain, urinary frequency x 3-4 days ago.  Past Medical History:  Diagnosis Date   Attention deficit hyperactivity disorder, predominantly inattentive type    prefers no medicines.   Cough 11/07/2020   Dizziness and giddiness 01/14/2020   Encounter for immunization 08/28/2020   Essential (primary) hypertension 2016   Essential hypertension, benign 08/28/2020   Fatigue 11/07/2020   Insomnia 01/14/2020   Left-sided headache 01/14/2020   Migraine with aura, intractable, without status migrainosus 1980   Mixed hyperlipidemia 08/28/2020   Other specified nontoxic goiter    Overweight    Paresthesias 01/14/2020   SLE (systemic lupus erythematosus related syndrome) 01/14/2020   Systemic lupus erythematosus, organ or system involvement unspecified 2006    Past Surgical History:  Procedure Laterality Date   CESAREAN SECTION      Family History  Problem Relation Age of Onset   Congestive Heart Failure Father     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  Tobacco Use   Smoking status: Former    Types: Cigarettes    Quit date: 11/09/1982    Years since quitting: 40.3   Smokeless tobacco: Never  Vaping Use   Vaping Use: Never used  Substance and Sexual Activity   Alcohol use: Not Currently   Drug use: Never   Sexual activity: Yes  Other Topics Concern   Not on file  Social History Narrative   Right handed   Lives with husband   Social Determinants of Health   Financial Resource Strain: Low  Risk  (10/09/2022)   Overall Financial Resource Strain (CARDIA)    Difficulty of Paying Living Expenses: Not hard at all  Food Insecurity: No Food Insecurity (10/09/2022)   Hunger Vital Sign    Worried About Running Out of Food in the Last Year: Never true    Ran Out of Food in the Last Year: Never true  Transportation Needs: No Transportation Needs (10/09/2022)   PRAPARE - Administrator, Civil Service (Medical): No    Lack of Transportation (Non-Medical): No  Physical Activity: Insufficiently Active (10/09/2022)   Exercise Vital Sign    Days of Exercise per Week: 3 days    Minutes of Exercise per Session: 20 min  Stress: No Stress Concern Present (10/09/2022)   [employer] of Occupational Health - Occupational Stress Questionnaire    Feeling of Stress : Not at all  Social Connections: Moderately Integrated (10/09/2022)   Social Connection and Isolation Panel [NHANES]    Frequency of Communication with Friends and Family: More than three times a week    Frequency of Social Gatherings with Friends and Family: More than three times a week    Attends Religious Services: More than 4 times per year    Active Member of [employer] or Organizations: No    Attends Banker Meetings: Never    Marital Status: Married  Catering manager Violence: Not At Risk (  10/09/2022)   Humiliation, Afraid, Rape, and Kick questionnaire    Fear of Current or Ex-Partner: No    Emotionally Abused: No    Physically Abused: No    Sexually Abused: No    Outpatient Medications Prior to Visit  Medication Sig Dispense Refill   albuterol (VENTOLIN HFA) 108 (90 Base) MCG/ACT inhaler Inhale 2 puffs into the lungs every 6 (six) hours as needed for wheezing or shortness of breath. 8 g 0   amLODipine (NORVASC) 5 MG tablet TAKE 1 TABLET(5 MG) BY MOUTH DAILY 90 tablet 0   atorvastatin (LIPITOR) 40 MG tablet Take 1 tablet (40 mg total) by mouth daily. 90 tablet 0   cyclobenzaprine (FLEXERIL) 5 MG tablet  Take 5 mg by mouth 3 (three) times daily as needed for muscle spasms.     estradiol (ESTRACE) 0.1 MG/GM vaginal cream SMARTSIG:Gram(s) Vaginal 3 Times a Week     hydroxychloroquine (PLAQUENIL) 200 MG tablet TAKE 1 TABLET(200 MG) BY MOUTH TWICE DAILY 180 tablet 0   omeprazole (PRILOSEC) 40 MG capsule Take 1 capsule (40 mg total) by mouth daily. 90 capsule 3   No facility-administered medications prior to visit.    No Known Allergies  Review of Systems  Constitutional:  Negative for chills and fever.  HENT:  Negative for congestion and sore throat.   Respiratory:  Negative for cough and shortness of breath.   Cardiovascular:  Negative for chest pain.  Gastrointestinal:  Negative for abdominal pain and nausea.  Genitourinary:  Positive for frequency. Negative for dysuria, flank pain and urgency.  Musculoskeletal:  Positive for back pain (lower).       Objective:        02/25/2023   11:56 AM 02/09/2023    7:57 AM 11/05/2022    8:48 AM  Vitals with BMI  Height 5' 6.5" 5' 6.5" 5' 6.5"  Weight 197 lbs 200 lbs 200 lbs  BMI 31.32 31.8 31.8  Systolic 130 120 161  Diastolic 74 82 80  Pulse 67 78 80    No data found.   Physical Exam Vitals reviewed.  Constitutional:      Appearance: Normal appearance. She is obese.  Cardiovascular:     Rate and Rhythm: Normal rate and regular rhythm.     Heart sounds: Normal heart sounds.  Pulmonary:     Effort: Pulmonary effort is normal. No respiratory distress.     Breath sounds: Normal breath sounds.  Abdominal:     General: Abdomen is flat. Bowel sounds are normal.     Palpations: Abdomen is soft.     Tenderness: There is no abdominal tenderness.  Neurological:     Mental Status: She is alert and oriented to person, place, and time.  Psychiatric:        Mood and Affect: Mood normal.        Behavior: Behavior normal.     Health Maintenance Due  Topic Date Due   HIV Screening  Never done   Hepatitis C Screening  Never done    DTaP/Tdap/Td (1 - Tdap) Never done   PAP SMEAR-Modifier  06/09/2014   COVID-19 Vaccine (5 - 2023-24 season) 07/10/2022   Zoster Vaccines- Shingrix (2 of 2) 10/29/2022    There are no preventive care reminders to display for this patient.   Lab Results  Component Value Date   TSH 2.300 09/03/2022   Lab Results  Component Value Date   WBC 4.1 09/03/2022   HGB 13.2 09/03/2022   HCT  41.0 09/03/2022   MCV 95 09/03/2022   PLT 301 09/03/2022   Lab Results  Component Value Date   NA 140 09/03/2022   K 3.8 09/03/2022   CO2 24 09/03/2022   GLUCOSE 82 09/03/2022   BUN 13 09/03/2022   CREATININE 0.84 09/03/2022   BILITOT 0.6 09/03/2022   ALKPHOS 99 09/03/2022   AST 26 09/03/2022   ALT 31 09/03/2022   PROT 7.7 09/03/2022   ALBUMIN 4.9 09/03/2022   CALCIUM 9.8 09/03/2022   EGFR 79 09/03/2022   Lab Results  Component Value Date   CHOL 188 09/03/2022   Lab Results  Component Value Date   HDL 56 09/03/2022   Lab Results  Component Value Date   LDLCALC 117 (H) 09/03/2022   Lab Results  Component Value Date   TRIG 83 09/03/2022   Lab Results  Component Value Date   CHOLHDL 3.4 09/03/2022   No results found for: "HGBA1C"     Assessment & Plan:  Acute cystitis without hematuria Assessment & Plan: Checked UA and Ordered Urine Culture. Sent Macrobid 100 mg BID.  Orders: -     POCT URINALYSIS DIP (CLINITEK) -     Urine Culture -     Nitrofurantoin Monohyd Macro; Take 1 capsule (100 mg total) by mouth 2 (two) times daily.  Dispense: 14 capsule; Refill: 0     Meds ordered this encounter  Medications   nitrofurantoin, macrocrystal-monohydrate, (MACROBID) 100 MG capsule    Sig: Take 1 capsule (100 mg total) by mouth 2 (two) times daily.    Dispense:  14 capsule    Refill:  0    Orders Placed This Encounter  Procedures   Urine Culture   POCT URINALYSIS DIP (CLINITEK)     Follow-up: Return if symptoms worsen or fail to improve.  An After Visit Summary was  printed and given to the patient.  Clayborn Bigness I Leal-Borjas,acting as a scribe for Blane Ohara, MD.,have documented all relevant documentation on the behalf of Blane Ohara, MD,as directed by  Blane Ohara, MD while in the presence of Blane Ohara, MD.   Blane Ohara, MD [facility] [phone]

## 2023-02-25 NOTE — Telephone Encounter (Signed)
LEFT VM FOR PT TO CALL THE MAIN OFFICE TO SCHEDULE ACUTE APPT FOR A POSSIBLE UTI

## 2023-02-26 LAB — URINE CULTURE

## 2023-02-27 NOTE — Assessment & Plan Note (Signed)
Checked UA and Ordered Urine Culture. Sent Macrobid 100 mg BID.

## 2023-02-28 ENCOUNTER — Encounter: Payer: Self-pay | Admitting: Family Medicine

## 2023-03-03 ENCOUNTER — Other Ambulatory Visit: Payer: Self-pay | Admitting: Family Medicine

## 2023-03-11 NOTE — Progress Notes (Signed)
Subjective:  Patient ID: Kathy Villa, female    DOB: 05/21/1961  Age: 62 y.o. MRN: [id number]  Chief Complaint  Patient presents with   Hyperlipidemia   Hypertension   Gastroesophageal Reflux    HPI   Hyperlipidemia:  Patient is currently taking Atorvastatin 40 mg take 1 tablet daily. Healthy diet. Exercising.  Hypertension: Patient is currently taking Amlodipine 5 mg take 1 tablet daily.  GERD: omeprazole 40 mg daily.   LUPUS: on plaquenil 200 mg one tablet twice daily.  Allergies: States zyrtec causes drowsiness, c/o sneezing, runny nose, coughing, fatigue     10/09/2022    8:08 AM 04/16/2022    9:03 AM 09/01/2020    9:28 PM 04/03/2020    8:25 AM 01/14/2020   11:43 PM  Depression screen PHQ 2/9  Decreased Interest 0 0 0 0 0  Down, Depressed, Hopeless 0 0 1 0 0  PHQ - 2 Score 0 0 1 0 0  Altered sleeping 0  1    Tired, decreased energy 0  1    Change in appetite 0  0    Feeling bad or failure about yourself  0  1    Trouble concentrating 0  1    Moving slowly or fidgety/restless 0  1    Suicidal thoughts 0  0    PHQ-9 Score 0  6    Difficult doing work/chores Not difficult at all            02/25/2023   12:04 PM  Fall Risk   Falls in the past year? 0  Number falls in past yr: 0  Injury with Fall? 0  Risk for fall due to : No Fall Risks  Follow up Falls evaluation completed    Patient Care Team: Blane Ohara, MD as PCP - General (Family Medicine)   Review of Systems  Constitutional:  Negative for chills, fatigue and fever.  HENT:  Positive for congestion, rhinorrhea and sneezing. Negative for ear pain, postnasal drip, sinus pressure, sinus pain and sore throat.   Respiratory:  Positive for cough. Negative for shortness of breath.   Cardiovascular:  Negative for chest pain.  Gastrointestinal:  Negative for abdominal pain, constipation, diarrhea, nausea and vomiting.  Genitourinary:  Negative for dysuria and urgency.  Musculoskeletal:  Negative for back  pain and myalgias.  Neurological:  Negative for dizziness, weakness, light-headedness and headaches.  Psychiatric/Behavioral:  Negative for dysphoric mood. The patient is not nervous/anxious.     Current Outpatient Medications on File Prior to Visit  Medication Sig Dispense Refill   amLODipine (NORVASC) 5 MG tablet TAKE 1 TABLET(5 MG) BY MOUTH DAILY 90 tablet 0   cyclobenzaprine (FLEXERIL) 5 MG tablet Take 5 mg by mouth 3 (three) times daily as needed for muscle spasms.     estradiol (ESTRACE) 0.1 MG/GM vaginal cream SMARTSIG:Gram(s) Vaginal 3 Times a Week     omeprazole (PRILOSEC) 40 MG capsule Take 1 capsule (40 mg total) by mouth daily. 90 capsule 3   No current facility-administered medications on file prior to visit.   Past Medical History:  Diagnosis Date   Attention deficit hyperactivity disorder, predominantly inattentive type    prefers no medicines.   Cough 11/07/2020   Dizziness and giddiness 01/14/2020   Encounter for immunization 08/28/2020   Essential (primary) hypertension 2016   Essential hypertension, benign 08/28/2020   Fatigue 11/07/2020   Insomnia 01/14/2020   Left-sided headache 01/14/2020   Migraine with aura, intractable, without status  migrainosus 1980   Mixed hyperlipidemia 08/28/2020   Other specified nontoxic goiter    Overweight    Paresthesias 01/14/2020   SLE (systemic lupus erythematosus related syndrome) (HCC) 01/14/2020   Systemic lupus erythematosus, organ or system involvement unspecified (HCC) 2006   Past Surgical History:  Procedure Laterality Date   CESAREAN SECTION      Family History  Problem Relation Age of Onset   Congestive Heart Failure Father    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  Tobacco Use    Smoking status: Former    Types: Cigarettes    Quit date: 11/09/1982    Years since quitting: 40.3   Smokeless tobacco: Never  Vaping Use   Vaping Use: Never used  Substance and Sexual Activity   Alcohol use: Not Currently   Drug use: Never   Sexual activity: Yes  Other Topics Concern   Not on file  Social History Narrative   Right handed   Lives with husband   Social Determinants of Health   Financial Resource Strain: Low Risk  (10/09/2022)   Overall Financial Resource Strain (CARDIA)    Difficulty of Paying Living Expenses: Not hard at all  Food Insecurity: No Food Insecurity (10/09/2022)   Hunger Vital Sign    Worried About Running Out of Food in the Last Year: Never true    Ran Out of Food in the Last Year: Never true  Transportation Needs: No Transportation Needs (10/09/2022)   PRAPARE - Administrator, Civil Service (Medical): No    Lack of Transportation (Non-Medical): No  Physical Activity: Insufficiently Active (10/09/2022)   Exercise Vital Sign    Days of Exercise per Week: 3 days    Minutes of Exercise per Session: 20 min  Stress: No Stress Concern Present (10/09/2022)   [employer] of Occupational Health - Occupational Stress Questionnaire    Feeling of Stress : Not at all  Social Connections: Moderately Integrated (10/09/2022)   Social Connection and Isolation Panel [NHANES]    Frequency of Communication with Friends and Family: More than three times a week    Frequency of Social Gatherings with Friends and Family: More than three times a week    Attends Religious Services: More than 4 times per year    Active Member of [employer] or Organizations: No    Attends Banker Meetings: Never    Marital Status: Married    Objective:  Pulse 82   Temp (!) 97.2 F (36.2 C)   Ht 5' 6.5" (1.689 m)   Wt 198 lb (89.8 kg)   SpO2 99%   BMI 31.48 kg/m      03/12/2023    7:41 AM 02/25/2023   11:56 AM 02/09/2023    7:57 AM  BP/Weight  Systolic  BP  130 120  Diastolic BP  74 82  Wt. (Lbs) 198 197 200  BMI 31.48 kg/m2 31.32 kg/m2 31.8 kg/m2    Physical Exam Vitals reviewed.  Constitutional:      Appearance: Normal appearance. She is normal weight.  HENT:     Right Ear: Tympanic membrane, ear canal and external ear normal.     Left Ear: Tympanic membrane, ear canal and external  ear normal.     Nose: Congestion (pale edematous turbinates.) present.     Right Turbinates: Enlarged, swollen and pale.     Left Turbinates: Enlarged, swollen and pale.     Mouth/Throat:     Pharynx: Oropharynx is clear.  Neck:     Vascular: No carotid bruit.  Cardiovascular:     Rate and Rhythm: Normal rate and regular rhythm.     Heart sounds: Normal heart sounds. No murmur heard. Pulmonary:     Effort: Pulmonary effort is normal. No respiratory distress.     Breath sounds: Wheezing present.  Neurological:     Mental Status: She is alert and oriented to person, place, and time.  Psychiatric:        Mood and Affect: Mood normal.        Behavior: Behavior normal.     Diabetic Foot Exam - Simple   No data filed      Lab Results  Component Value Date   WBC 4.6 03/12/2023   HGB 12.3 03/12/2023   HCT 39.1 03/12/2023   PLT 217 03/12/2023   GLUCOSE 87 03/12/2023   CHOL 103 03/12/2023   TRIG 57 03/12/2023   HDL 45 03/12/2023   LDLCALC 45 03/12/2023   ALT 65 (H) 03/12/2023   AST 38 03/12/2023   NA 143 03/12/2023   K 3.7 03/12/2023   CL 103 03/12/2023   CREATININE 0.94 03/12/2023   BUN 12 03/12/2023   CO2 23 03/12/2023   TSH 2.300 09/03/2022      Assessment & Plan:    Essential hypertension, benign Assessment & Plan: Well controlled.  No changes to medicines. Continue amlodipine 5 mg daily.  Continue to work on eating a healthy diet and exercise.  Labs drawn today.    Orders: -     Comprehensive metabolic panel  Gastroesophageal reflux disease without esophagitis Assessment & Plan: The current medical regimen is  effective;  continue present plan and medications. Continue omeprazole 40 mg daily.    Mixed hyperlipidemia Assessment & Plan: Well controlled.  No changes to medicines. Continue  Atorvastatin 40 mg take 1 tablet daily. Continue to work on eating a healthy diet and exercise.  Labs drawn today.    Orders: -     Atorvastatin Calcium; Take 1 tablet (40 mg total) by mouth daily.  Dispense: 90 tablet; Refill: 0 -     CBC with Differential/Platelet -     Lipid panel  Acute cough Assessment & Plan: Checked rapid covid test. Negative.  Orders: -     POC COVID-19 BinaxNow  Class 1 obesity due to excess calories with serious comorbidity and body mass index (BMI) of 31.0 to 31.9 in adult Assessment & Plan: Recommend continue to work on eating healthy diet and exercise.    Seasonal allergies Assessment & Plan: Sent azelastine and albuterol inhaler.  Orders: -     Azelastine HCl; Place 2 sprays into both nostrils 2 (two) times daily. Use in each nostril as directed  Dispense: 30 mL; Refill: 2 -     Albuterol Sulfate HFA; Inhale 2 puffs into the lungs every 6 (six) hours as needed for wheezing or shortness of breath.  Dispense: 8 g; Refill: 0  SLE (systemic lupus erythematosus related syndrome) (HCC) Assessment & Plan: The current medical regimen is effective;  continue present plan and medications. Management per specialist.  Refilled plaquenil.  Orders: -     Hydroxychloroquine Sulfate; TAKE 1 TABLET(200 MG) BY MOUTH TWICE DAILY  Dispense: 180 tablet; Refill: 0     Meds ordered this encounter  Medications   atorvastatin (LIPITOR) 40 MG tablet    Sig: Take 1 tablet (40 mg total) by mouth daily.    Dispense:  90 tablet    Refill:  0   hydroxychloroquine (PLAQUENIL) 200 MG tablet    Sig: TAKE 1 TABLET(200 MG) BY MOUTH TWICE DAILY    Dispense:  180 tablet    Refill:  0   azelastine (ASTELIN) 0.1 % nasal spray    Sig: Place 2 sprays into both nostrils 2 (two) times daily.  Use in each nostril as directed    Dispense:  30 mL    Refill:  2   albuterol (VENTOLIN HFA) 108 (90 Base) MCG/ACT inhaler    Sig: Inhale 2 puffs into the lungs every 6 (six) hours as needed for wheezing or shortness of breath.    Dispense:  8 g    Refill:  0    Orders Placed This Encounter  Procedures   CBC with Differential/Platelet   Comprehensive metabolic panel   Lipid panel   POC COVID-19 BinaxNow     Follow-up: Return in about 3 months (around 06/12/2023) for chronic, fasting.   I,Marla I Leal-Borjas,acting as a scribe for Blane Ohara, MD.,have documented all relevant documentation on the behalf of Blane Ohara, MD,as directed by  Blane Ohara, MD while in the presence of Blane Ohara, MD.   An After Visit Summary was printed and given to the patient.  Blane Ohara, MD [facility] [phone]

## 2023-03-12 ENCOUNTER — Encounter: Payer: Self-pay | Admitting: Family Medicine

## 2023-03-12 ENCOUNTER — Ambulatory Visit (INDEPENDENT_AMBULATORY_CARE_PROVIDER_SITE_OTHER): Payer: BC Managed Care – PPO | Admitting: Family Medicine

## 2023-03-12 VITALS — BP 130/72 | HR 82 | Temp 97.2°F | Ht 66.5 in | Wt 198.0 lb

## 2023-03-12 DIAGNOSIS — E782 Mixed hyperlipidemia: Secondary | ICD-10-CM | POA: Diagnosis not present

## 2023-03-12 DIAGNOSIS — J302 Other seasonal allergic rhinitis: Secondary | ICD-10-CM

## 2023-03-12 DIAGNOSIS — E6609 Other obesity due to excess calories: Secondary | ICD-10-CM

## 2023-03-12 DIAGNOSIS — F900 Attention-deficit hyperactivity disorder, predominantly inattentive type: Secondary | ICD-10-CM

## 2023-03-12 DIAGNOSIS — R051 Acute cough: Secondary | ICD-10-CM

## 2023-03-12 DIAGNOSIS — M329 Systemic lupus erythematosus, unspecified: Secondary | ICD-10-CM

## 2023-03-12 DIAGNOSIS — K219 Gastro-esophageal reflux disease without esophagitis: Secondary | ICD-10-CM | POA: Diagnosis not present

## 2023-03-12 DIAGNOSIS — I10 Essential (primary) hypertension: Secondary | ICD-10-CM

## 2023-03-12 DIAGNOSIS — Z6831 Body mass index (BMI) 31.0-31.9, adult: Secondary | ICD-10-CM

## 2023-03-12 HISTORY — DX: Other seasonal allergic rhinitis: J30.2

## 2023-03-12 LAB — POC COVID19 BINAXNOW: SARS Coronavirus 2 Ag: NEGATIVE

## 2023-03-12 MED ORDER — ATORVASTATIN CALCIUM 40 MG PO TABS: 40.0000 mg | ORAL_TABLET | Freq: Every day | ORAL | 0 refills | Status: DC

## 2023-03-12 MED ORDER — HYDROXYCHLOROQUINE SULFATE 200 MG PO TABS: ORAL_TABLET | ORAL | 0 refills | Status: DC

## 2023-03-12 MED ORDER — ALBUTEROL SULFATE HFA 108 (90 BASE) MCG/ACT IN AERS
2.0000 | INHALATION_SPRAY | Freq: Four times a day (QID) | RESPIRATORY_TRACT | 0 refills | Status: DC | PRN
Start: 2023-03-12 — End: 2024-09-06

## 2023-03-12 MED ORDER — AZELASTINE HCL 0.1 % NA SOLN
2.0000 | Freq: Two times a day (BID) | NASAL | 2 refills | Status: AC
Start: 2023-03-12 — End: ?

## 2023-03-12 NOTE — Assessment & Plan Note (Addendum)
Checked rapid covid test. Negative.

## 2023-03-12 NOTE — Patient Instructions (Signed)
Sent azelastine and albuterol inhaler.

## 2023-03-12 NOTE — Assessment & Plan Note (Signed)
Well controlled.  ?No changes to medicines. Continue amlodipine 5 mg daily.  ?Continue to work on eating a healthy diet and exercise.  ?Labs drawn today.  ? ?

## 2023-03-12 NOTE — Assessment & Plan Note (Signed)
Sent azelastine and albuterol inhaler. 

## 2023-03-12 NOTE — Assessment & Plan Note (Signed)
Recommend continue to work on eating healthy diet and exercise.  

## 2023-03-12 NOTE — Assessment & Plan Note (Signed)
Well controlled.  No changes to medicines. Continue  Atorvastatin 40 mg take 1 tablet daily. Continue to work on eating a healthy diet and exercise.  Labs drawn today.   

## 2023-03-12 NOTE — Assessment & Plan Note (Signed)
The current medical regimen is effective;  continue present plan and medications. Continue omeprazole 40 mg daily.  

## 2023-03-13 LAB — CBC WITH DIFFERENTIAL/PLATELET
Basophils Absolute: 0 10*3/uL (ref 0.0–0.2)
Basos: 1 %
EOS (ABSOLUTE): 0.2 10*3/uL (ref 0.0–0.4)
Eos: 3 %
Hematocrit: 39.1 % (ref 34.0–46.6)
Hemoglobin: 12.3 g/dL (ref 11.1–15.9)
Immature Grans (Abs): 0 10*3/uL (ref 0.0–0.1)
Immature Granulocytes: 0 %
Lymphocytes Absolute: 1.7 10*3/uL (ref 0.7–3.1)
Lymphs: 38 %
MCH: 30.2 pg (ref 26.6–33.0)
MCHC: 31.5 g/dL (ref 31.5–35.7)
MCV: 96 fL (ref 79–97)
Monocytes Absolute: 0.4 10*3/uL (ref 0.1–0.9)
Monocytes: 9 %
Neutrophils Absolute: 2.3 10*3/uL (ref 1.4–7.0)
Neutrophils: 49 %
Platelets: 217 10*3/uL (ref 150–450)
RBC: 4.07 x10E6/uL (ref 3.77–5.28)
RDW: 11.9 % (ref 11.7–15.4)
WBC: 4.6 10*3/uL (ref 3.4–10.8)

## 2023-03-13 LAB — COMPREHENSIVE METABOLIC PANEL
ALT: 65 IU/L — ABNORMAL HIGH (ref 0–32)
AST: 38 IU/L (ref 0–40)
Albumin/Globulin Ratio: 1.7 (ref 1.2–2.2)
Albumin: 4.6 g/dL (ref 3.9–4.9)
Alkaline Phosphatase: 108 IU/L (ref 44–121)
BUN/Creatinine Ratio: 13 (ref 12–28)
BUN: 12 mg/dL (ref 8–27)
Bilirubin Total: 0.8 mg/dL (ref 0.0–1.2)
CO2: 23 mmol/L (ref 20–29)
Calcium: 9.8 mg/dL (ref 8.7–10.3)
Chloride: 103 mmol/L (ref 96–106)
Creatinine, Ser: 0.94 mg/dL (ref 0.57–1.00)
Globulin, Total: 2.7 g/dL (ref 1.5–4.5)
Glucose: 87 mg/dL (ref 70–99)
Potassium: 3.7 mmol/L (ref 3.5–5.2)
Sodium: 143 mmol/L (ref 134–144)
Total Protein: 7.3 g/dL (ref 6.0–8.5)
eGFR: 69 mL/min/{1.73_m2} (ref >59)

## 2023-03-13 LAB — LIPID PANEL
Chol/HDL Ratio: 2.3 ratio (ref 0.0–4.4)
Cholesterol, Total: 103 mg/dL (ref 100–199)
HDL: 45 mg/dL (ref >39)
LDL Chol Calc (NIH): 45 mg/dL (ref 0–99)
Triglycerides: 57 mg/dL (ref 0–149)
VLDL Cholesterol Cal: 13 mg/dL (ref 5–40)

## 2023-03-21 NOTE — Assessment & Plan Note (Signed)
The current medical regimen is effective;  continue present plan and medications. Management per specialist.  Refilled plaquenil.

## 2023-03-29 ENCOUNTER — Other Ambulatory Visit: Payer: Self-pay

## 2023-03-29 DIAGNOSIS — E782 Mixed hyperlipidemia: Secondary | ICD-10-CM

## 2023-03-29 MED ORDER — ATORVASTATIN CALCIUM 40 MG PO TABS
40.0000 mg | ORAL_TABLET | Freq: Every day | ORAL | 0 refills | Status: DC
Start: 2023-03-29 — End: 2023-07-16

## 2023-04-02 ENCOUNTER — Other Ambulatory Visit: Payer: Self-pay

## 2023-04-02 ENCOUNTER — Other Ambulatory Visit: Payer: BC Managed Care – PPO

## 2023-04-02 DIAGNOSIS — R748 Abnormal levels of other serum enzymes: Secondary | ICD-10-CM

## 2023-04-02 LAB — COMPREHENSIVE METABOLIC PANEL
ALT: 62 IU/L — ABNORMAL HIGH (ref 0–32)
AST: 40 IU/L (ref 0–40)
Albumin/Globulin Ratio: 1.8 (ref 1.2–2.2)
Albumin: 4.4 g/dL (ref 3.9–4.9)
Alkaline Phosphatase: 102 IU/L (ref 44–121)
BUN/Creatinine Ratio: 15 (ref 12–28)
BUN: 15 mg/dL (ref 8–27)
Bilirubin Total: 0.8 mg/dL (ref 0.0–1.2)
CO2: 24 mmol/L (ref 20–29)
Calcium: 9.4 mg/dL (ref 8.7–10.3)
Chloride: 103 mmol/L (ref 96–106)
Creatinine, Ser: 0.99 mg/dL (ref 0.57–1.00)
Globulin, Total: 2.4 g/dL (ref 1.5–4.5)
Glucose: 83 mg/dL (ref 70–99)
Potassium: 4.1 mmol/L (ref 3.5–5.2)
Sodium: 142 mmol/L (ref 134–144)
Total Protein: 6.8 g/dL (ref 6.0–8.5)
eGFR: 64 mL/min/{1.73_m2} (ref >59)

## 2023-05-26 ENCOUNTER — Ambulatory Visit (INDEPENDENT_AMBULATORY_CARE_PROVIDER_SITE_OTHER): Payer: BC Managed Care – PPO | Admitting: Family Medicine

## 2023-05-26 ENCOUNTER — Telehealth: Payer: Self-pay

## 2023-05-26 ENCOUNTER — Encounter: Payer: Self-pay | Admitting: Family Medicine

## 2023-05-26 VITALS — BP 122/82 | HR 77 | Temp 97.7°F | Ht 66.5 in | Wt 199.0 lb

## 2023-05-26 DIAGNOSIS — R300 Dysuria: Secondary | ICD-10-CM

## 2023-05-26 DIAGNOSIS — N3000 Acute cystitis without hematuria: Secondary | ICD-10-CM | POA: Diagnosis not present

## 2023-05-26 LAB — POCT URINALYSIS DIPSTICK
Bilirubin, UA: NEGATIVE
Blood, UA: NEGATIVE
Glucose, UA: NEGATIVE
Ketones, UA: NEGATIVE
Nitrite, UA: NEGATIVE
Protein, UA: NEGATIVE
Spec Grav, UA: 1.015 (ref 1.010–1.025)
Urobilinogen, UA: NEGATIVE E.U./dL — AB
pH, UA: 6 (ref 5.0–8.0)

## 2023-05-26 MED ORDER — NITROFURANTOIN MONOHYD MACRO 100 MG PO CAPS
100.0000 mg | ORAL_CAPSULE | Freq: Two times a day (BID) | ORAL | 0 refills | Status: DC
Start: 2023-05-26 — End: 2023-10-18

## 2023-05-26 NOTE — Telephone Encounter (Signed)
Patient called and stated that she think she might have a UTI, wanted to come in to be seen.   Appointment made 05/26/23 at 3:30 PM Per Dr. Sedalia Muta

## 2023-05-26 NOTE — Progress Notes (Unsigned)
Acute Office Visit  Subjective:    Patient ID: Kathy Villa, female    DOB: [birth year], 62 y.o.   MRN: [id number]  Chief Complaint  Patient presents with   Urinary Tract Infection     Patient is in today for UTI symptoms. Patient states over the past few days she has had dysuria and states that she just had the gut feeling that she has a UTI. Patient denies low back pain/fever/blood in urine/urinary frequency.  Past Medical History:  Diagnosis Date   Attention deficit hyperactivity disorder, predominantly inattentive type    prefers no medicines.   Cough 11/07/2020   Dizziness and giddiness 01/14/2020   Encounter for immunization 08/28/2020   Essential (primary) hypertension 2016   Essential hypertension, benign 08/28/2020   Fatigue 11/07/2020   Insomnia 01/14/2020   Left-sided headache 01/14/2020   Migraine with aura, intractable, without status migrainosus 1980   Mixed hyperlipidemia 08/28/2020   Other specified nontoxic goiter    Overweight    Paresthesias 01/14/2020   SLE (systemic lupus erythematosus related syndrome) (HCC) 01/14/2020   Systemic lupus erythematosus, organ or system involvement unspecified (HCC) 2006    Past Surgical History:  Procedure Laterality Date   CESAREAN SECTION      Family History  Problem Relation Age of Onset   Congestive Heart Failure Father     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  Tobacco Use   Smoking status: Former    Current packs/day: 0.00    Types: Cigarettes    Quit date: 11/09/1982    Years since quitting: 40.5   Smokeless tobacco: Never  Vaping Use   Vaping status: Never Used  Substance and Sexual Activity   Alcohol use: Not Currently   Drug use: Never   Sexual activity: Yes   Other Topics Concern   Not on file  Social History Narrative   Right handed   Lives with husband   Social Determinants of Health   Financial Resource Strain: Low Risk  (10/09/2022)   Overall Financial Resource Strain (CARDIA)    Difficulty of Paying Living Expenses: Not hard at all  Food Insecurity: No Food Insecurity (10/09/2022)   Hunger Vital Sign    Worried About Running Out of Food in the Last Year: Never true    Ran Out of Food in the Last Year: Never true  Transportation Needs: No Transportation Needs (10/09/2022)   PRAPARE - Administrator, Civil Service (Medical): No    Lack of Transportation (Non-Medical): No  Physical Activity: Insufficiently Active (10/09/2022)   Exercise Vital Sign    Days of Exercise per Week: 3 days    Minutes of Exercise per Session: 20 min  Stress: No Stress Concern Present (10/09/2022)   [employer] of Occupational Health - Occupational Stress Questionnaire    Feeling of Stress : Not at all  Social Connections: Moderately Integrated (10/09/2022)   Social Connection and Isolation Panel [NHANES]    Frequency of Communication with Friends and Family: More than three times a week    Frequency of Social Gatherings with Friends and Family: More than three times a week    Attends Religious Services: More than 4 times per year  Active Member of Clubs or Organizations: No    Attends Banker Meetings: Never    Marital Status: Married  Catering manager Violence: Not At Risk (10/09/2022)   Humiliation, Afraid, Rape, and Kick questionnaire    Fear of Current or Ex-Partner: No    Emotionally Abused: No    Physically Abused: No    Sexually Abused: No    Outpatient Medications Prior to Visit  Medication Sig Dispense Refill   albuterol (VENTOLIN HFA) 108 (90 Base) MCG/ACT inhaler Inhale 2 puffs into the lungs every 6 (six) hours as needed for wheezing or shortness of breath. 8 g 0   amLODipine (NORVASC) 5 MG tablet TAKE 1  TABLET(5 MG) BY MOUTH DAILY 90 tablet 0   atorvastatin (LIPITOR) 40 MG tablet Take 1 tablet (40 mg total) by mouth daily. 90 tablet 0   azelastine (ASTELIN) 0.1 % nasal spray Place 2 sprays into both nostrils 2 (two) times daily. Use in each nostril as directed 30 mL 2   cyclobenzaprine (FLEXERIL) 5 MG tablet Take 5 mg by mouth 3 (three) times daily as needed for muscle spasms.     estradiol (ESTRACE) 0.1 MG/GM vaginal cream SMARTSIG:Gram(s) Vaginal 3 Times a Week     hydroxychloroquine (PLAQUENIL) 200 MG tablet TAKE 1 TABLET(200 MG) BY MOUTH TWICE DAILY 180 tablet 0   omeprazole (PRILOSEC) 40 MG capsule Take 1 capsule (40 mg total) by mouth daily. 90 capsule 3   No facility-administered medications prior to visit.    No Known Allergies  Review of Systems  Constitutional:  Negative for appetite change, fatigue and fever.  HENT:  Negative for congestion, ear pain, sinus pressure and sore throat.   Respiratory:  Negative for cough, chest tightness, shortness of breath and wheezing.   Cardiovascular:  Negative for chest pain and palpitations.  Gastrointestinal:  Negative for abdominal pain, constipation, diarrhea, nausea and vomiting.  Genitourinary:  Positive for dysuria. Negative for hematuria.  Musculoskeletal:  Negative for arthralgias, back pain, joint swelling and myalgias.  Skin:  Negative for rash.  Neurological:  Negative for dizziness, weakness and headaches.  Psychiatric/Behavioral:  Negative for dysphoric mood. The patient is not nervous/anxious.        Objective:    Physical Exam Vitals reviewed.  Constitutional:      Appearance: Normal appearance. She is normal weight.  Cardiovascular:     Rate and Rhythm: Normal rate and regular rhythm.     Heart sounds: Normal heart sounds.  Pulmonary:     Effort: Pulmonary effort is normal.     Breath sounds: Normal breath sounds.  Abdominal:     General: Abdomen is flat. Bowel sounds are normal.     Palpations: Abdomen is soft.   Neurological:     Mental Status: She is alert and oriented to person, place, and time.  Psychiatric:        Mood and Affect: Mood normal.        Behavior: Behavior normal.     BP 122/82 (BP Location: Left Arm, Patient Position: Sitting)   Pulse 77   Temp 97.7 F (36.5 C) (Temporal)   Ht 5' 6.5" (1.689 m)   Wt 199 lb (90.3 kg)   SpO2 98%   BMI 31.64 kg/m  Wt Readings from Last 3 Encounters:  05/26/23 199 lb (90.3 kg)  03/12/23 198 lb (89.8 kg)  02/25/23 197 lb (89.4 kg)    Health Maintenance Due  Topic Date Due   HIV Screening  Never  done   Hepatitis C Screening  Never done   DTaP/Tdap/Td (1 - Tdap) Never done   PAP SMEAR-Modifier  06/09/2014   COVID-19 Vaccine (5 - 2023-24 season) 07/10/2022   Zoster Vaccines- Shingrix (2 of 2) 10/29/2022    There are no preventive care reminders to display for this patient.   Lab Results  Component Value Date   TSH 2.300 09/03/2022   Lab Results  Component Value Date   WBC 4.6 03/12/2023   HGB 12.3 03/12/2023   HCT 39.1 03/12/2023   MCV 96 03/12/2023   PLT 217 03/12/2023   Lab Results  Component Value Date   NA 142 04/02/2023   K 4.1 04/02/2023   CO2 24 04/02/2023   GLUCOSE 83 04/02/2023   BUN 15 04/02/2023   CREATININE 0.99 04/02/2023   BILITOT 0.8 04/02/2023   ALKPHOS 102 04/02/2023   AST 40 04/02/2023   ALT 62 (H) 04/02/2023   PROT 6.8 04/02/2023   ALBUMIN 4.4 04/02/2023   CALCIUM 9.4 04/02/2023   EGFR 64 04/02/2023   Lab Results  Component Value Date   CHOL 103 03/12/2023   Lab Results  Component Value Date   HDL 45 03/12/2023   Lab Results  Component Value Date   LDLCALC 45 03/12/2023   Lab Results  Component Value Date   TRIG 57 03/12/2023   Lab Results  Component Value Date   CHOLHDL 2.3 03/12/2023   No results found for: "HGBA1C"       Assessment & Plan:   1. Dysuria *** - POCT urinalysis dipstick    Meds ordered this encounter  Medications   nitrofurantoin,  macrocrystal-monohydrate, (MACROBID) 100 MG capsule    Sig: Take 1 capsule (100 mg total) by mouth 2 (two) times daily.    Dispense:  14 capsule    Refill:  0    I,Marla I Leal-Borjas,acting as a scribe for Blane Ohara, MD.,have documented all relevant documentation on the behalf of Blane Ohara, MD,as directed by  Blane Ohara, MD while in the presence of Blane Ohara, MD.    Eugenie Norrie, CMA

## 2023-05-28 LAB — URINE CULTURE

## 2023-05-29 DIAGNOSIS — N3000 Acute cystitis without hematuria: Secondary | ICD-10-CM | POA: Insufficient documentation

## 2023-05-29 DIAGNOSIS — R300 Dysuria: Secondary | ICD-10-CM

## 2023-05-29 HISTORY — DX: Acute cystitis without hematuria: N30.00

## 2023-05-29 HISTORY — DX: Dysuria: R30.0

## 2023-05-29 NOTE — Assessment & Plan Note (Signed)
Order Urine culture Sent Macrobid.

## 2023-05-29 NOTE — Assessment & Plan Note (Signed)
Check UA 

## 2023-06-01 ENCOUNTER — Other Ambulatory Visit: Payer: Self-pay | Admitting: Family Medicine

## 2023-07-06 IMAGING — DX DG ABDOMEN 1V
1 series · 1 of 1 positions shown · non-contrast
Comparison: None.

CLINICAL DATA: Microscopic hematuria

EXAM:
ABDOMEN - 1 VIEW

[w abdomen upright]
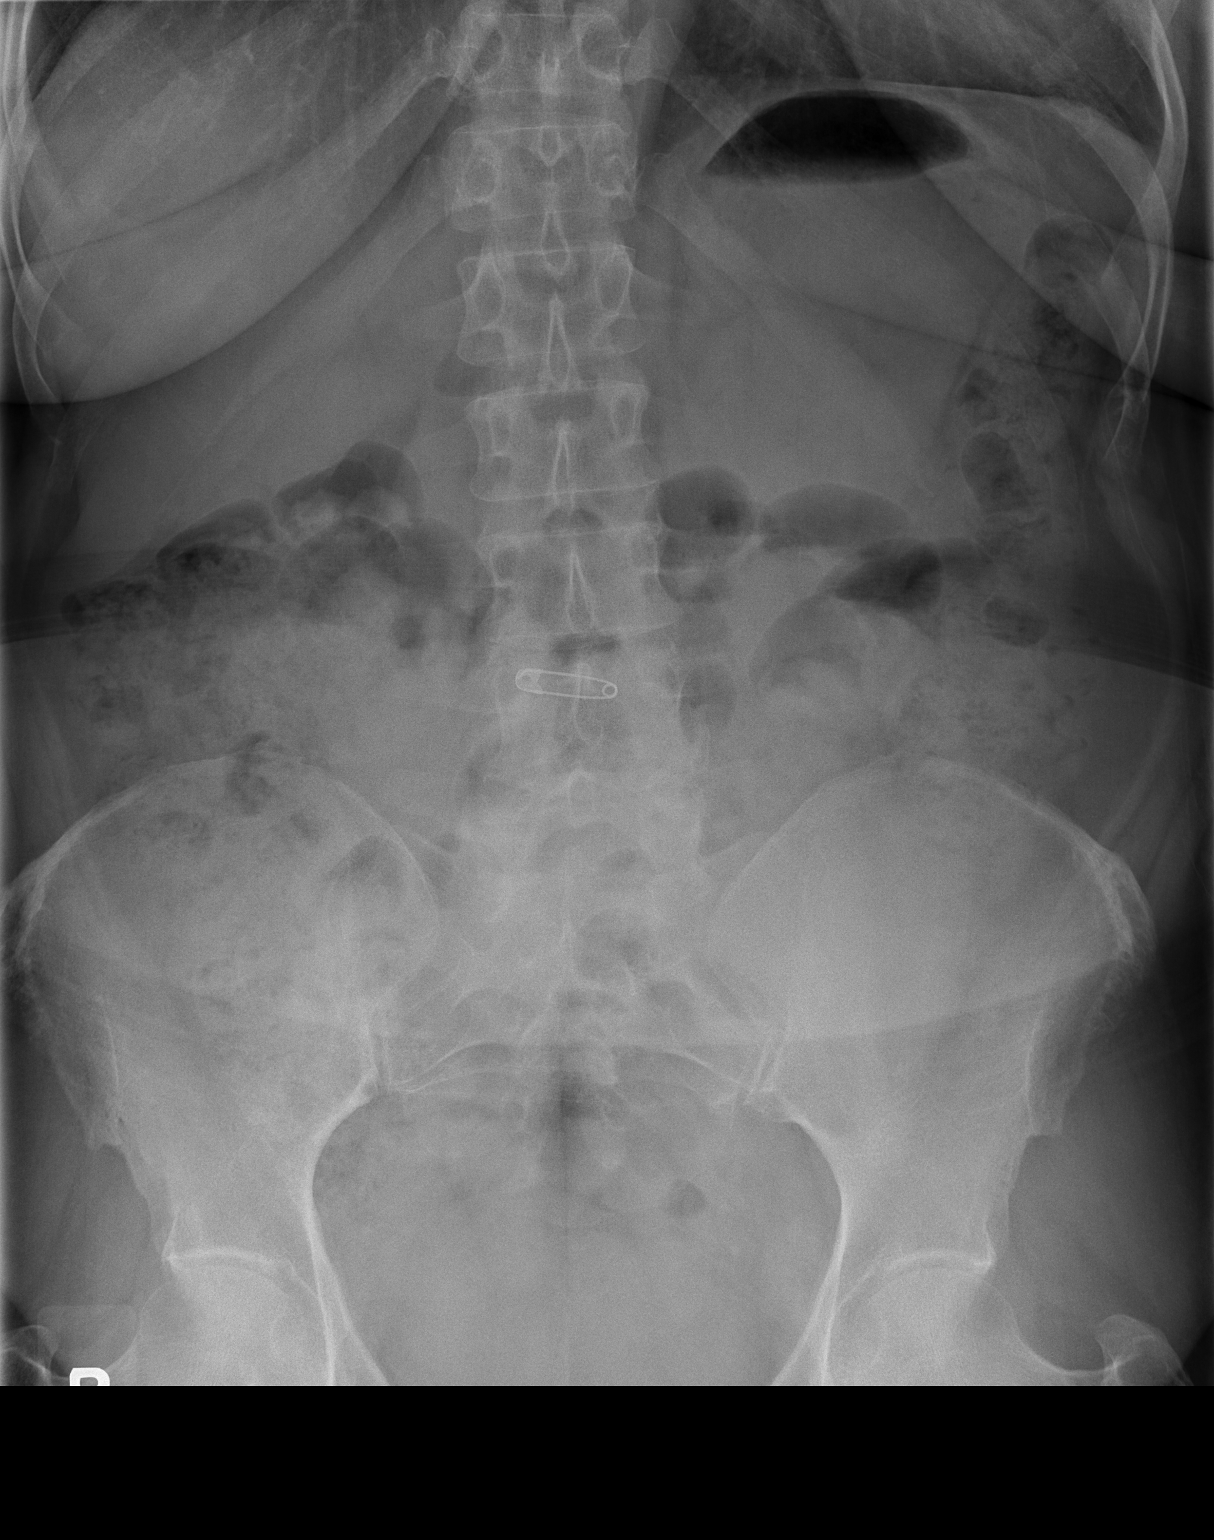

[1 of 1 positions shown; findings below may reference images not displayed]

FINDINGS: Supine frontal view of the abdomen and pelvis excludes the
hemidiaphragms by collimation. Bowel gas pattern is unremarkable. No
radiopaque urinary tract calculi. Mild left convex lower lumbar
scoliosis and spondylosis. No acute bony abnormalities.
IMPRESSION: 1. No radiopaque urinary tract calculi.

## 2023-07-06 IMAGING — CT CT ABD-PEL WO/W CM
2 of 5 series · 13 of 32 positions shown, 18 images · IV contrast (agent unspecified)
Comparison: None.

CLINICAL DATA: Frequent UTI.  Microhematuria

EXAM:
CT ABDOMEN AND PELVIS WITHOUT AND WITH CONTRAST
TECHNIQUE: Multidetector CT imaging of the abdomen and pelvis was performed
following the standard protocol before and following the bolus
administration of intravenous contrast.

[Series 5: arterial 3.0 · axial · arterial · 0.97mm/px · z∈[-405,-48]mm · 8 of 153 slices shown, 13 images]
[im 17/153  soft-tissue]
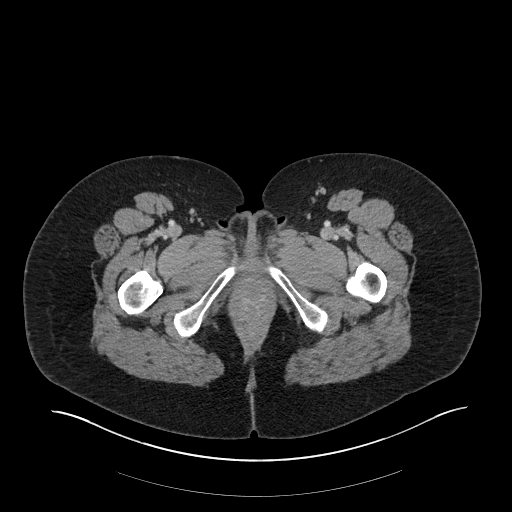
[im 17/153  bone]
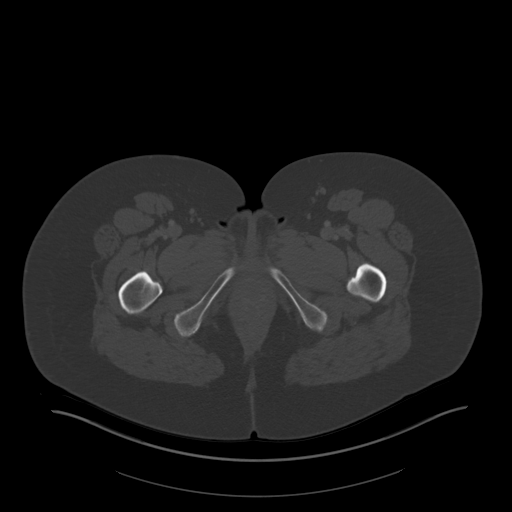
[im 34/153  soft-tissue]
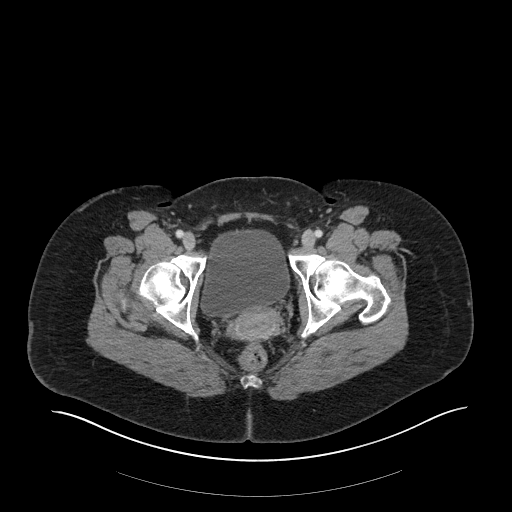
[im 51/153  soft-tissue]
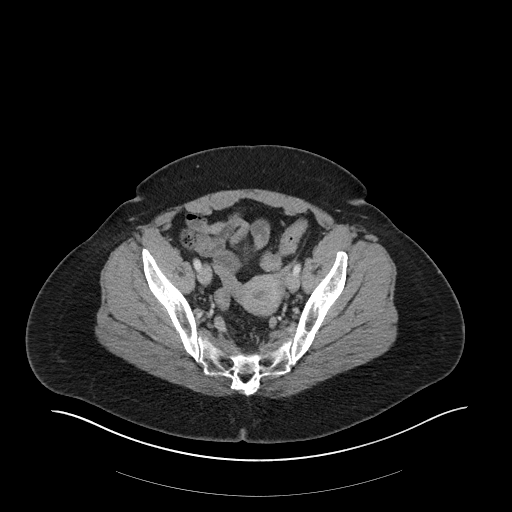
[im 68/153  soft-tissue]
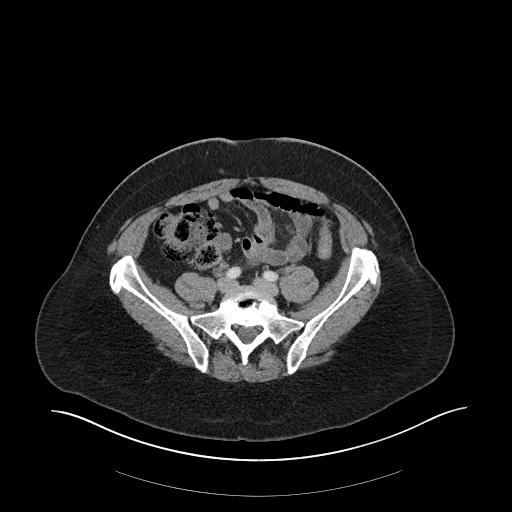
[im 85/153  soft-tissue]
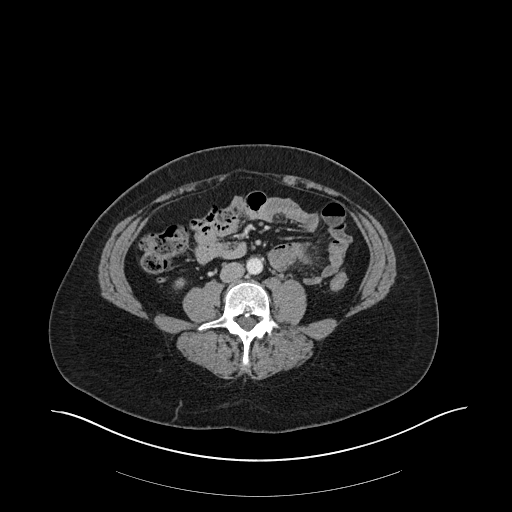
[im 85/153  lung]
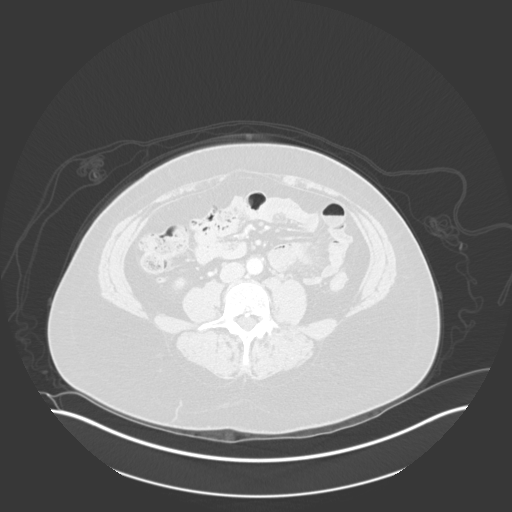
[im 102/153  soft-tissue]
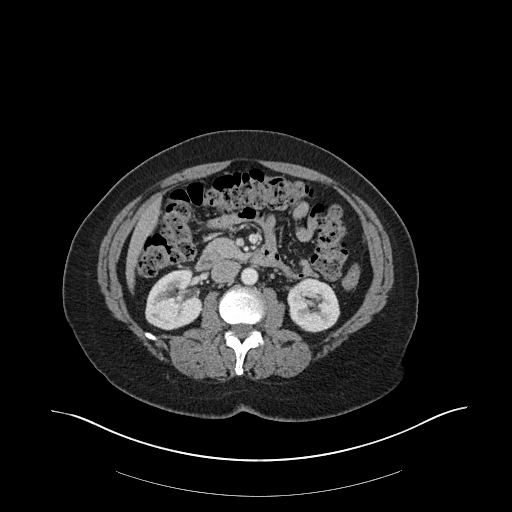
[im 102/153  lung]
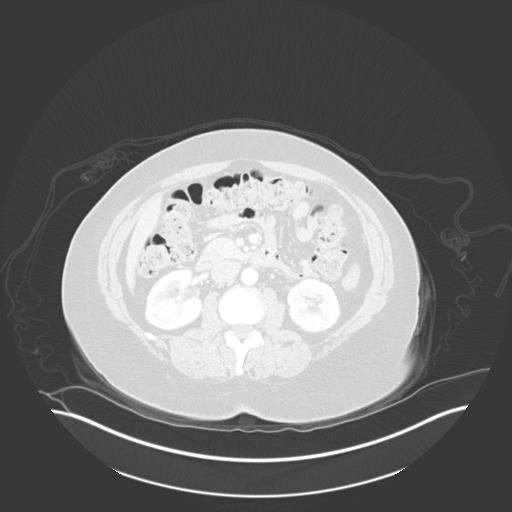
[im 119/153  soft-tissue]
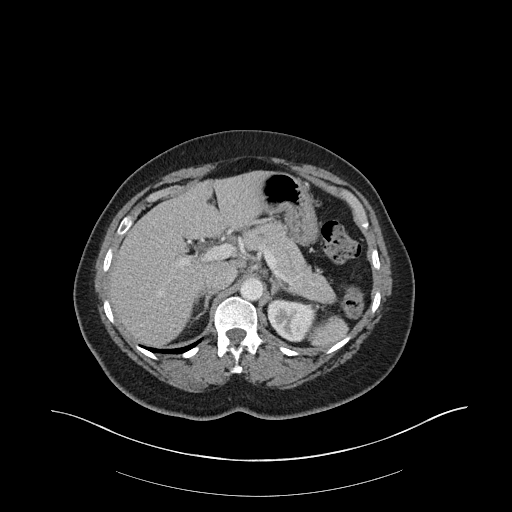
[im 119/153  lung]
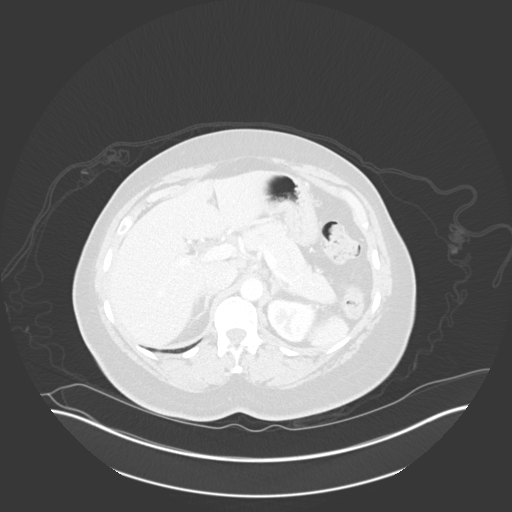
[im 136/153  soft-tissue]
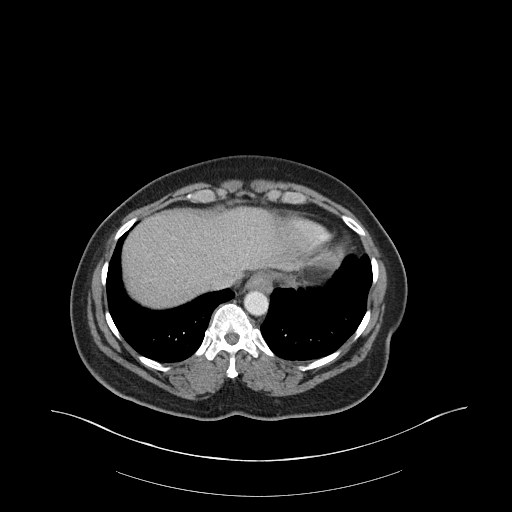
[im 136/153  lung]
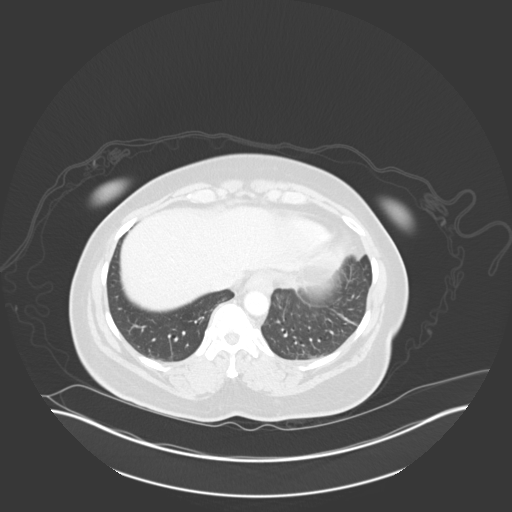

[Series 7: nephro 3.0 · axial · 0.97mm/px · z∈[-404,-200]mm · 5 of 153 slices shown]
[im 17/153  soft-tissue]
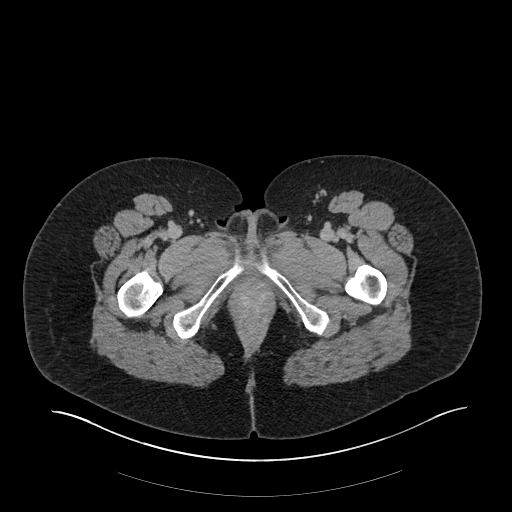
[im 34/153  soft-tissue]
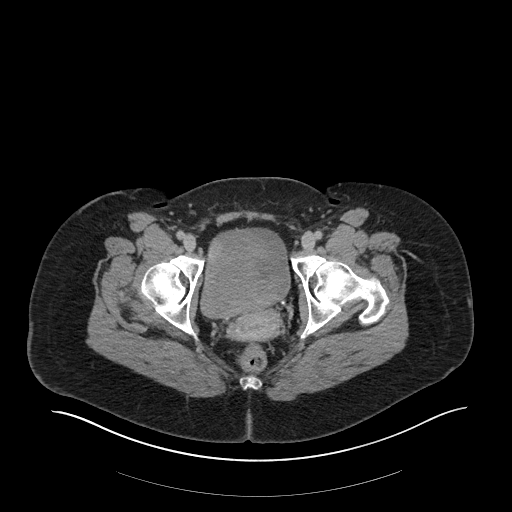
[im 51/153  soft-tissue]
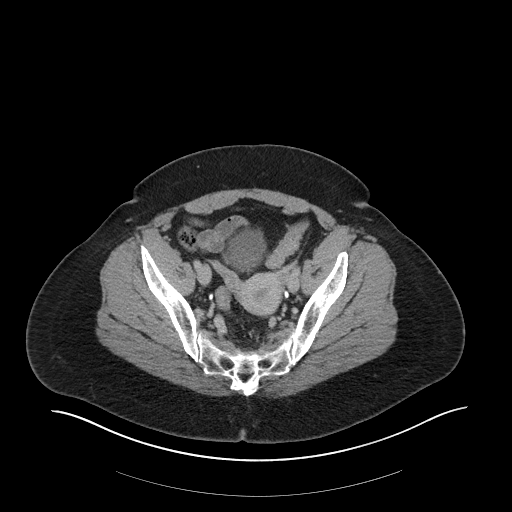
[im 68/153  soft-tissue]
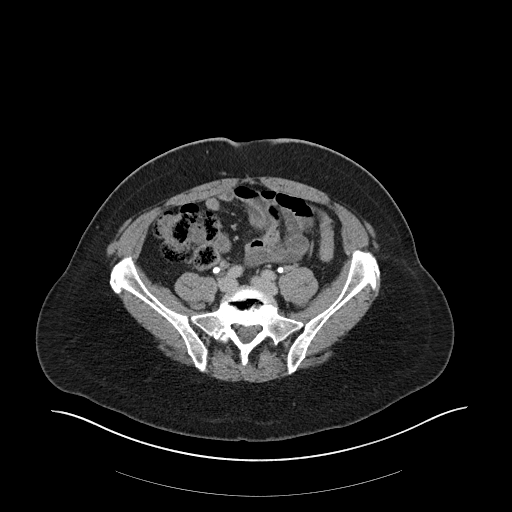
[im 85/153  soft-tissue]
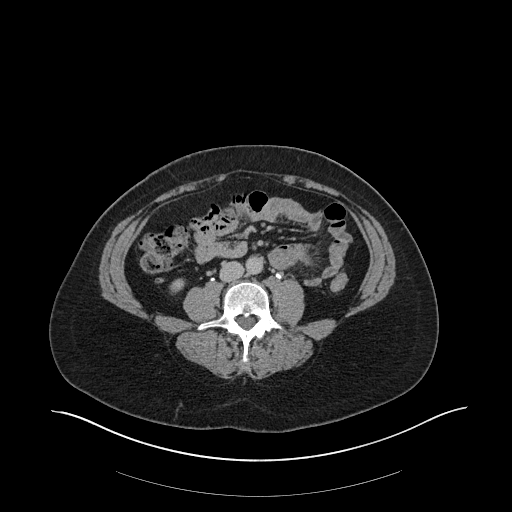

[13 of 32 positions shown; findings below may reference images not displayed]

RADIATION DOSE REDUCTION: This exam was performed according to the
departmental dose-optimization program which includes automated
exposure control, adjustment of the mA and/or kV according to
patient size and/or use of iterative reconstruction technique.

CONTRAST:  100mL [id number] IOPAMIDOL ([id number]) INJECTION 61%
FINDINGS: Lower chest: No acute abnormality.

Hepatobiliary: Liver is normal in size and contour. There are
multiple tiny hypodensities scattered throughout the liver measuring
up to 6 mm in size in the left lobe, which are too small to
characterize but most likely represent cysts or hemangiomas.
Gallbladder appears normal. No biliary ductal dilatation.

Pancreas: Unremarkable. No pancreatic ductal dilatation or
surrounding inflammatory changes.

Spleen: Normal in size without focal abnormality.

Adrenals/Urinary Tract: Adrenal glands are normal. No
nephrolithiasis or hydronephrosis identified bilaterally. No
enhancing renal mass or suspicious collecting system filling defects
identified bilaterally. Single tiny hypodense cortical cysts
identified bilaterally. Ureters are normal caliber. Urinary bladder
appears within normal limits.

Stomach/Bowel: Tiny hiatal hernia. No bowel obstruction, free air or
pneumatosis. No bowel wall edema identified. Moderate amount of
retained fecal material throughout the colon. Appendix is normal.

Vascular/Lymphatic: No significant vascular findings are present. No
enlarged abdominal or pelvic lymph nodes.

Reproductive: Uterus and bilateral adnexa are unremarkable.

Other: No ascites.

Musculoskeletal: Degenerative changes in the lower lumbar spine. No
suspicious bony lesions identified.
IMPRESSION: 1. No nephrolithiasis, hydronephrosis or suspicious mass identified.
2. Multiple tiny hypodensities in the liver which are too small to
characterize, most likely represent cysts. Follow-up if indicated.

## 2023-07-16 ENCOUNTER — Other Ambulatory Visit: Payer: Self-pay

## 2023-07-16 DIAGNOSIS — M329 Systemic lupus erythematosus, unspecified: Secondary | ICD-10-CM

## 2023-07-16 DIAGNOSIS — E782 Mixed hyperlipidemia: Secondary | ICD-10-CM

## 2023-07-16 MED ORDER — ATORVASTATIN CALCIUM 40 MG PO TABS: 40.0000 mg | ORAL_TABLET | Freq: Every day | ORAL | 0 refills | Status: DC

## 2023-07-16 MED ORDER — AMLODIPINE BESYLATE 5 MG PO TABS: 5.0000 mg | ORAL_TABLET | Freq: Every day | ORAL | 0 refills | Status: DC

## 2023-07-16 MED ORDER — HYDROXYCHLOROQUINE SULFATE 200 MG PO TABS
ORAL_TABLET | ORAL | 0 refills | Status: DC
Start: 2023-07-16 — End: 2023-11-11

## 2023-07-19 ENCOUNTER — Other Ambulatory Visit: Payer: Self-pay

## 2023-07-19 DIAGNOSIS — S61101A Unspecified open wound of right thumb with damage to nail, initial encounter: Secondary | ICD-10-CM

## 2023-07-19 MED ORDER — OMEPRAZOLE 40 MG PO CPDR
40.0000 mg | DELAYED_RELEASE_CAPSULE | Freq: Every day | ORAL | 3 refills | Status: DC
Start: 2023-07-19 — End: 2024-08-04

## 2023-09-13 ENCOUNTER — Other Ambulatory Visit: Payer: Self-pay

## 2023-10-18 ENCOUNTER — Ambulatory Visit (INDEPENDENT_AMBULATORY_CARE_PROVIDER_SITE_OTHER): Payer: BC Managed Care – PPO | Admitting: Family Medicine

## 2023-10-18 ENCOUNTER — Encounter: Payer: Self-pay | Admitting: Family Medicine

## 2023-10-18 VITALS — BP 138/84 | HR 71 | Temp 97.1°F | Ht 66.5 in | Wt 200.8 lb

## 2023-10-18 DIAGNOSIS — E6609 Other obesity due to excess calories: Secondary | ICD-10-CM | POA: Diagnosis not present

## 2023-10-18 DIAGNOSIS — M25561 Pain in right knee: Secondary | ICD-10-CM | POA: Diagnosis not present

## 2023-10-18 DIAGNOSIS — E66811 Obesity, class 1: Secondary | ICD-10-CM

## 2023-10-18 DIAGNOSIS — Z6831 Body mass index (BMI) 31.0-31.9, adult: Secondary | ICD-10-CM

## 2023-10-18 DIAGNOSIS — G8929 Other chronic pain: Secondary | ICD-10-CM

## 2023-10-18 DIAGNOSIS — L600 Ingrowing nail: Secondary | ICD-10-CM

## 2023-10-18 NOTE — Progress Notes (Unsigned)
Acute Office Visit  Subjective:    Patient ID: Kathy Villa, female    DOB: 07/18/1961, 62 y.o.   MRN: [id number]  Chief Complaint  Patient presents with   Concerns with diet    Discussed the use of AI scribe software for clinical note transcription with the patient, who gave verbal consent to proceed.  HPI: Patient is in today for Concerns about her diet. Patient states that she feels like everything and anything that she eats affects her in some way whether is be salt or sugar. She also claims Right knee pain and Both left and right great toe have in grown toe nails.   The patient presents with a self-described intolerance to salt, which manifests as nausea, headaches, and frequent urination, particularly at night. She reports that these symptoms occur after consuming foods high in salt, and have been ongoing for a while. The patient has adjusted her diet to limit salt intake, but still experiences symptoms when consuming foods prepared by others.  The patient also reports chronic knee pain, which she attributes to an old injury. The pain is exacerbated by cold weather, and is relieved by taking Aleve.  Lastly, the patient has been experiencing pain in both great toes due to ingrown toenails. The pain is exacerbated by wearing certain shoes, and the patient has attempted to manage the condition at home with a [place] pedicure tool, but without success. The patient also notes that the toenails have darkened, which could indicate a fungal infection.  Past Medical History:  Diagnosis Date   Attention deficit hyperactivity disorder, predominantly inattentive type    prefers no medicines.   Cough 11/07/2020   Dizziness and giddiness 01/14/2020   Encounter for immunization 08/28/2020   Essential (primary) hypertension 2016   Essential hypertension, benign 08/28/2020   Fatigue 11/07/2020   Insomnia 01/14/2020   Left-sided headache 01/14/2020   Migraine with aura, intractable, without  status migrainosus 1980   Mixed hyperlipidemia 08/28/2020   Other specified nontoxic goiter    Overweight    Paresthesias 01/14/2020   SLE (systemic lupus erythematosus related syndrome) (HCC) 01/14/2020   Systemic lupus erythematosus, organ or system involvement unspecified (HCC) 2006    Past Surgical History:  Procedure Laterality Date   CESAREAN SECTION      Family History  Problem Relation Age of Onset   Congestive Heart Failure Father     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  Tobacco Use   Smoking status: Former    Current packs/day: 0.00    Types: Cigarettes    Quit date: 11/09/1982    Years since quitting: 40.9   Smokeless tobacco: Never  Vaping Use   Vaping status: Never Used  Substance and Sexual Activity   Alcohol use: Not Currently   Drug use: Never   Sexual activity: Yes  Other Topics Concern   Not on file  Social History Narrative   Right handed   Lives with husband   Social Determinants of Health   Financial Resource Strain: Low Risk  (10/09/2022)   Overall Financial Resource Strain (CARDIA)    Difficulty of Paying Living Expenses: Not hard at all  Food Insecurity: No Food Insecurity (10/09/2022)   Hunger  Vital Sign    Worried About Programme researcher, broadcasting/film/video in the Last Year: Never true    Ran Out of Food in the Last Year: Never true  Transportation Needs: No Transportation Needs (10/09/2022)   PRAPARE - Administrator, Civil Service (Medical): No    Lack of Transportation (Non-Medical): No  Physical Activity: Insufficiently Active (10/09/2022)   Exercise Vital Sign    Days of Exercise per Week: 3 days    Minutes of Exercise per Session: 20 min  Stress: No Stress Concern Present (10/09/2022)   [employer] of Occupational Health - Occupational Stress Questionnaire    Feeling of Stress : Not at all  Social Connections: Moderately Integrated (10/09/2022)   Social Connection and Isolation Panel [NHANES]    Frequency of Communication with Friends and Family: More than three times a week    Frequency of Social Gatherings with Friends and Family: More than three times a week    Attends Religious Services: More than 4 times per year    Active Member of [employer] or Organizations: No    Attends Banker Meetings: Never    Marital Status: Married  Catering manager Violence: Not At Risk (10/09/2022)   Humiliation, Afraid, Rape, and Kick questionnaire    Fear of Current or Ex-Partner: No    Emotionally Abused: No    Physically Abused: No    Sexually Abused: No    Outpatient Medications Prior to Visit  Medication Sig Dispense Refill   albuterol (VENTOLIN HFA) 108 (90 Base) MCG/ACT inhaler Inhale 2 puffs into the lungs every 6 (six) hours as needed for wheezing or shortness of breath. 8 g 0   amLODipine (NORVASC) 5 MG tablet Take 1 tablet (5 mg total) by mouth daily. 90 tablet 0   atorvastatin (LIPITOR) 40 MG tablet Take 1 tablet (40 mg total) by mouth daily. 90 tablet 0   azelastine (ASTELIN) 0.1 % nasal spray Place 2 sprays into both nostrils 2 (two) times daily. Use in each nostril as directed 30 mL 2   cyclobenzaprine (FLEXERIL) 5 MG tablet Take 5 mg by mouth 3 (three) times daily as needed for muscle spasms.     estradiol (ESTRACE) 0.1 MG/GM vaginal cream SMARTSIG:Gram(s) Vaginal 3 Times a Week     hydroxychloroquine (PLAQUENIL) 200 MG tablet TAKE 1 TABLET(200 MG) BY MOUTH TWICE DAILY 180 tablet 0   omeprazole (PRILOSEC) 40 MG capsule Take 1 capsule (40 mg total) by mouth daily. 90 capsule 3   nitrofurantoin, macrocrystal-monohydrate, (MACROBID) 100 MG capsule Take 1 capsule (100 mg total) by mouth 2 (two) times daily. 14 capsule 0   No facility-administered medications prior to  visit.    No Known Allergies  Review of Systems  Constitutional:  Negative for appetite change, fatigue and fever.  HENT:  Negative for congestion, ear pain, sinus pressure and sore throat.   Respiratory:  Negative for cough, chest tightness, shortness of breath and wheezing.   Cardiovascular:  Negative for chest pain and palpitations.  Gastrointestinal:  Negative for abdominal pain, constipation, diarrhea, nausea and vomiting.  Genitourinary:  Negative for dysuria and hematuria.  Musculoskeletal:  Negative for arthralgias, back pain, joint swelling and myalgias.  Skin:  Negative for rash.  Neurological:  Negative for dizziness, weakness and headaches.  Psychiatric/Behavioral:  Negative for dysphoric mood. The patient is not nervous/anxious.        Objective:        10/18/2023    3:40 PM 05/26/2023  3:51 PM 03/12/2023    7:41 AM  Vitals with BMI  Height 5' 6.5" 5' 6.5" 5' 6.5"  Weight 200 lbs 13 oz 199 lbs 198 lbs  BMI 31.93 31.64 31.48  Systolic 138 122 161  Diastolic 84 82 72  Pulse 71 77 82    Orthostatic VS for the past 72 hrs (Last 3 readings):  Patient Position BP Location  10/18/23 1540 Sitting Left Arm     Physical Exam Vitals reviewed.  Constitutional:      Appearance: Normal appearance. She is obese.  Neck:     Vascular: No carotid bruit.  Cardiovascular:     Rate and Rhythm: Normal rate and regular rhythm.     Heart sounds: Normal heart sounds.  Pulmonary:     Effort: Pulmonary effort is normal. No respiratory distress.     Breath sounds: Normal breath sounds.  Abdominal:     General: Abdomen is flat. Bowel sounds are normal.     Palpations: Abdomen is soft.     Tenderness: There is no abdominal tenderness.  Musculoskeletal:     Comments: RIGHT KNEE EXAM Tender: negative Patellar apprehension: negative. Latera ligament laxity: negative McMurray's signs: negative Anterior drawer movement/Lachmans: negative Posterior drawer movement:  negative Some edema of upper lateral knee.  LEFT KNEE EXAM Tender: negative Patellar apprehension: negative. Latera ligament laxity: negative McMurray's signs: negative Anterior drawer movement/Lachmans: negative Posterior drawer movement: negative   Skin:    Comments: BL thickened great nails.  Ingrown toe nails. Discolored nails.  Neurological:     Mental Status: She is alert and oriented to person, place, and time.  Psychiatric:        Mood and Affect: Mood normal.        Behavior: Behavior normal.     Health Maintenance Due  Topic Date Due   HIV Screening  Never done   Hepatitis C Screening  Never done   DTaP/Tdap/Td (1 - Tdap) Never done   Zoster Vaccines- Shingrix (2 of 2) 10/29/2022   INFLUENZA VACCINE  06/10/2023   COVID-19 Vaccine (5 - 2023-24 season) 07/11/2023    There are no preventive care reminders to display for this patient.   Lab Results  Component Value Date   TSH 2.300 09/03/2022   Lab Results  Component Value Date   WBC 4.6 03/12/2023   HGB 12.3 03/12/2023   HCT 39.1 03/12/2023   MCV 96 03/12/2023   PLT 217 03/12/2023   Lab Results  Component Value Date   NA 142 04/02/2023   K 4.1 04/02/2023   CO2 24 04/02/2023   GLUCOSE 83 04/02/2023   BUN 15 04/02/2023   CREATININE 0.99 04/02/2023   BILITOT 0.8 04/02/2023   ALKPHOS 102 04/02/2023   AST 40 04/02/2023   ALT 62 (H) 04/02/2023   PROT 6.8 04/02/2023   ALBUMIN 4.4 04/02/2023   CALCIUM 9.4 04/02/2023   EGFR 64 04/02/2023   Lab Results  Component Value Date   CHOL 103 03/12/2023   Lab Results  Component Value Date   HDL 45 03/12/2023   Lab Results  Component Value Date   LDLCALC 45 03/12/2023   Lab Results  Component Value Date   TRIG 57 03/12/2023   Lab Results  Component Value Date   CHOLHDL 2.3 03/12/2023   No results found for: "HGBA1C"     Assessment & Plan:  Ingrown toenail of both feet Assessment & Plan: Painful bilateral great toenails, likely secondary to  improper nail trimming and  possibly fungal infection. Discussed the need for nail removal to alleviate pain. -Schedule bilateral toenail removal on 10/20/2023.    Class 1 obesity due to excess calories with serious comorbidity and body mass index (BMI) of 31.0 to 31.9 in adult Assessment & Plan: Expressed desire for assistance with weight loss. Discussed potential medication options. -Consider prescription weight loss medication, such as Contrave, if diet and exercise are insufficient.    Chronic pain of right knee Assessment & Plan: History of old injury with recent flare of pain during cold weather, possibly due to arthritis or old meniscal tear. Pain improved with Aleve. -Continue Aleve as needed for pain. -Consider further evaluation with imaging and physical therapy if pain worsens or does not improve with weight loss.      No orders of the defined types were placed in this encounter.   No orders of the defined types were placed in this encounter.   Total time spent on today's visit was greater than 30 minutes, including both face-to-face time and nonface-to-face time personally spent on review of chart (labs and imaging), discussing labs and goals, discussing further work-up, treatment options, referrals to specialist if needed, reviewing outside records of pertinent, answering patient's questions, and coordinating care.  Follow-up: Return in about 2 days (around 10/20/2023).  An After Visit Summary was printed and given to the patient.  I,Lauren M Auman,acting as a scribe for Blane Ohara, MD.,have documented all relevant documentation on the behalf of Blane Ohara, MD,as directed by  Blane Ohara, MD while in the presence of Blane Ohara, MD.   I attest that I have reviewed this visit and agree with the plan scribed by my staff.   Blane Ohara, MD [facility] [phone]

## 2023-10-19 NOTE — Progress Notes (Unsigned)
Acute Office Visit  Subjective:    Patient ID: Kathy Villa, female    DOB: 03/15/1961, 62 y.o.   MRN: [id number]  Chief Complaint  Patient presents with   Toe Pain    Discussed the use of AI scribe software for clinical note transcription with the patient, who gave verbal consent to proceed.   HPI: Patient is in today for ingrown toenail removals and weight loss. She researched contrave and would like to try this. Eats healthy. Exercises.   Past Medical History:  Diagnosis Date   Attention deficit hyperactivity disorder, predominantly inattentive type    prefers no medicines.   Cough 11/07/2020   Dizziness and giddiness 01/14/2020   Encounter for immunization 08/28/2020   Essential (primary) hypertension 2016   Essential hypertension, benign 08/28/2020   Fatigue 11/07/2020   Insomnia 01/14/2020   Left-sided headache 01/14/2020   Migraine with aura, intractable, without status migrainosus 1980   Mixed hyperlipidemia 08/28/2020   Other specified nontoxic goiter    Overweight    Paresthesias 01/14/2020   SLE (systemic lupus erythematosus related syndrome) (HCC) 01/14/2020   Systemic lupus erythematosus, organ or system involvement unspecified (HCC) 2006    Past Surgical History:  Procedure Laterality Date   CESAREAN SECTION      Family History  Problem Relation Age of Onset   Congestive Heart Failure Father     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  Tobacco Use   Smoking status: Former    Current packs/day: 0.00    Types: Cigarettes    Quit date: 11/09/1982    Years since quitting: 40.9   Smokeless tobacco: Never  Vaping Use   Vaping status: Never Used  Substance and Sexual Activity   Alcohol use: Not Currently   Drug use:  Never   Sexual activity: Yes  Other Topics Concern   Not on file  Social History Narrative   Right handed   Lives with husband   Social Drivers of Health   Financial Resource Strain: Low Risk  (10/09/2022)   Overall Financial Resource Strain (CARDIA)    Difficulty of Paying Living Expenses: Not hard at all  Food Insecurity: No Food Insecurity (10/09/2022)   Hunger Vital Sign    Worried About Running Out of Food in the Last Year: Never true    Ran Out of Food in the Last Year: Never true  Transportation Needs: No Transportation Needs (10/09/2022)   PRAPARE - Administrator, Civil Service (Medical): No    Lack of Transportation (Non-Medical): No  Physical Activity: Insufficiently Active (10/09/2022)   Exercise Vital Sign    Days of Exercise per Week: 3 days    Minutes of Exercise per Session: 20 min  Stress: No Stress Concern Present (10/09/2022)   [employer] of Occupational Health - Occupational Stress Questionnaire    Feeling of Stress : Not at all  Social Connections: Moderately Integrated (10/09/2022)   Social Connection and Isolation Panel [NHANES]    Frequency of Communication with Friends and Family: More than three times a week    Frequency of Social Gatherings with Friends and Family: More than three times a week    Attends Religious Services: More than 4  times per year    Active Member of Clubs or Organizations: No    Attends Banker Meetings: Never    Marital Status: Married  Catering manager Violence: Not At Risk (10/09/2022)   Humiliation, Afraid, Rape, and Kick questionnaire    Fear of Current or Ex-Partner: No    Emotionally Abused: No    Physically Abused: No    Sexually Abused: No    Outpatient Medications Prior to Visit  Medication Sig Dispense Refill   albuterol (VENTOLIN HFA) 108 (90 Base) MCG/ACT inhaler Inhale 2 puffs into the lungs every 6 (six) hours as needed for wheezing or shortness of breath. 8 g 0   amLODipine (NORVASC)  5 MG tablet Take 1 tablet (5 mg total) by mouth daily. 90 tablet 0   atorvastatin (LIPITOR) 40 MG tablet Take 1 tablet (40 mg total) by mouth daily. 90 tablet 0   azelastine (ASTELIN) 0.1 % nasal spray Place 2 sprays into both nostrils 2 (two) times daily. Use in each nostril as directed 30 mL 2   cyclobenzaprine (FLEXERIL) 5 MG tablet Take 5 mg by mouth 3 (three) times daily as needed for muscle spasms.     estradiol (ESTRACE) 0.1 MG/GM vaginal cream SMARTSIG:Gram(s) Vaginal 3 Times a Week     hydroxychloroquine (PLAQUENIL) 200 MG tablet TAKE 1 TABLET(200 MG) BY MOUTH TWICE DAILY 180 tablet 0   omeprazole (PRILOSEC) 40 MG capsule Take 1 capsule (40 mg total) by mouth daily. 90 capsule 3   No facility-administered medications prior to visit.    No Known Allergies  Review of Systems  Constitutional:  Negative for chills and fever.       Objective:        10/20/2023    1:37 PM 10/18/2023    3:40 PM 05/26/2023    3:51 PM  Vitals with BMI  Height 5' 6.5" 5' 6.5" 5' 6.5"  Weight 200 lbs 13 oz 200 lbs 13 oz 199 lbs  BMI 31.93 31.93 31.64  Systolic 132 138 161  Diastolic 82 84 82  Pulse 67 71 77    No data found.   Physical Exam Vitals reviewed.  Constitutional:      Appearance: Normal appearance. She is obese.  Musculoskeletal:     Comments: Ingrown toenails BL.  No purulent drainage.   Neurological:     Mental Status: She is alert.    Procedure: Risks and benefits discussed with patient including bleeding, continued or worsening infection, failure of toenail to regrow.  Patient agreed to procedure.  Toes BL cleansed with alcohol. Rubber band tourniquet applied.  Anesthesia: digital block at DIP BL Feet: Lidocaine 6 cc without epinephrine each great toe.  Entire toenail removed Bilaterally..  Tourniquet removed.  Antibiotic gel applied. Compression dressing applied.  Complications: none.  Antibiotic prescribed:  Health Maintenance Due  Topic Date Due   HIV  Screening  Never done   Hepatitis C Screening  Never done   DTaP/Tdap/Td (1 - Tdap) Never done   Zoster Vaccines- Shingrix (2 of 2) 10/29/2022   INFLUENZA VACCINE  06/10/2023   COVID-19 Vaccine (5 - 2024-25 season) 07/11/2023    There are no preventive care reminders to display for this patient.   Lab Results  Component Value Date   TSH 2.300 09/03/2022   Lab Results  Component Value Date   WBC 4.6 03/12/2023   HGB 12.3 03/12/2023   HCT 39.1 03/12/2023   MCV 96 03/12/2023   PLT 217 03/12/2023  Lab Results  Component Value Date   NA 142 04/02/2023   K 4.1 04/02/2023   CO2 24 04/02/2023   GLUCOSE 83 04/02/2023   BUN 15 04/02/2023   CREATININE 0.99 04/02/2023   BILITOT 0.8 04/02/2023   ALKPHOS 102 04/02/2023   AST 40 04/02/2023   ALT 62 (H) 04/02/2023   PROT 6.8 04/02/2023   ALBUMIN 4.4 04/02/2023   CALCIUM 9.4 04/02/2023   EGFR 64 04/02/2023   Lab Results  Component Value Date   CHOL 103 03/12/2023   Lab Results  Component Value Date   HDL 45 03/12/2023   Lab Results  Component Value Date   LDLCALC 45 03/12/2023   Lab Results  Component Value Date   TRIG 57 03/12/2023   Lab Results  Component Value Date   CHOLHDL 2.3 03/12/2023   No results found for: "HGBA1C"     Assessment & Plan:  Ingrown toenail of both feet Assessment & Plan: Keep compression dressing on toes overnight, then may remove and use antibiotic ointment and a bandaid. Education given.    Class 1 obesity due to excess calories with serious comorbidity and body mass index (BMI) of 31.0 to 31.9 in adult Assessment & Plan: Start contrave one tablet in am x 1 week, then increase to one twice daily x 1 week, then increase to 2 in am and one in pm x 1 week, then increase to 2 oral twice daily.   The prescription for contrave I sent will have directions for 2 oral twice daily. Do above instructions instead.   Other orders -     Contrave; Take 2 tablets by mouth 2 (two) times daily.   Dispense: 120 tablet; Refill: 0     Meds ordered this encounter  Medications   Naltrexone-buPROPion HCl ER (CONTRAVE) 8-90 MG TB12    Sig: Take 2 tablets by mouth 2 (two) times daily.    Dispense:  120 tablet    Refill:  0    No orders of the defined types were placed in this encounter.    Follow-up: Return in about 5 weeks (around 11/24/2023).  An After Visit Summary was printed and given to the patient.   Clayborn Bigness I Leal-Borjas,acting as a scribe for Blane Ohara, MD.,have documented all relevant documentation on the behalf of Blane Ohara, MD,as directed by  Blane Ohara, MD while in the presence of Blane Ohara, MD.   Blane Ohara, MD [facility] [phone]

## 2023-10-19 NOTE — Progress Notes (Incomplete)
Acute Office Visit  Subjective:    Patient ID: Kathy Villa, female    DOB: 11-18-1960, 62 y.o.   MRN: [id number]  Chief Complaint  Patient presents with  . Concerns with diet    Discussed the use of AI scribe software for clinical note transcription with the patient, who gave verbal consent to proceed.  HPI: Patient is in today for Concerns about her diet. Patient states that she feels like everything and anything that she eats affects her in some way whether is be salt or sugar. She also claims Right knee pain and Both left and right great toe have in grown toe nails.   The patient presents with a self-described intolerance to salt, which manifests as nausea, headaches, and frequent urination, particularly at night. She reports that these symptoms occur after consuming foods high in salt, and have been ongoing for a while. The patient has adjusted her diet to limit salt intake, but still experiences symptoms when consuming foods prepared by others.  The patient also reports chronic knee pain, which she attributes to an old injury. The pain is exacerbated by cold weather, and is relieved by taking Aleve.  Lastly, the patient has been experiencing pain in both great toes due to ingrown toenails. The pain is exacerbated by wearing certain shoes, and the patient has attempted to manage the condition at home with a [place] pedicure tool, but without success. The patient also notes that the toenails have darkened, which could indicate a fungal infection.  Past Medical History:  Diagnosis Date  . Attention deficit hyperactivity disorder, predominantly inattentive type    prefers no medicines.  . Cough 11/07/2020  . Dizziness and giddiness 01/14/2020  . Encounter for immunization 08/28/2020  . Essential (primary) hypertension 2016  . Essential hypertension, benign 08/28/2020  . Fatigue 11/07/2020  . Insomnia 01/14/2020  . Left-sided headache 01/14/2020  . Migraine with aura, intractable,  without status migrainosus 1980  . Mixed hyperlipidemia 08/28/2020  . Other specified nontoxic goiter   . Overweight   . Paresthesias 01/14/2020  . SLE (systemic lupus erythematosus related syndrome) (HCC) 01/14/2020  . Systemic lupus erythematosus, organ or system involvement unspecified (HCC) 2006    Past Surgical History:  Procedure Laterality Date  . CESAREAN SECTION      Family History  Problem Relation Age of Onset  . Congestive Heart Failure Father     Social History   Socioeconomic History  . Marital status: Married    Spouse name: Not on file  . Number of children: 3  . Years of education: Not on file  . Highest education level: Not on file  Occupational History  . Occupation: Energy manager of [employer]  Tobacco Use  . Smoking status: Former    Current packs/day: 0.00    Types: Cigarettes    Quit date: 11/09/1982    Years since quitting: 40.9  . Smokeless tobacco: Never  Vaping Use  . Vaping status: Never Used  Substance and Sexual Activity  . Alcohol use: Not Currently  . Drug use: Never  . Sexual activity: Yes  Other Topics Concern  . Not on file  Social History Narrative   Right handed   Lives with husband   Social Determinants of Health   Financial Resource Strain: Low Risk  (10/09/2022)   Overall Financial Resource Strain (CARDIA)   . Difficulty of Paying Living Expenses: Not hard at all  Food Insecurity: No Food Insecurity (10/09/2022)   Hunger  Vital Sign   . Worried About Programme researcher, broadcasting/film/video in the Last Year: Never true   . Ran Out of Food in the Last Year: Never true  Transportation Needs: No Transportation Needs (10/09/2022)   PRAPARE - Transportation   . Lack of Transportation (Medical): No   . Lack of Transportation (Non-Medical): No  Physical Activity: Insufficiently Active (10/09/2022)   Exercise Vital Sign   . Days of Exercise per Week: 3 days   . Minutes of Exercise per Session: 20 min  Stress: No Stress Concern  Present (10/09/2022)   [employer] of Occupational Health - Occupational Stress Questionnaire   . Feeling of Stress : Not at all  Social Connections: Moderately Integrated (10/09/2022)   Social Connection and Isolation Panel [NHANES]   . Frequency of Communication with Friends and Family: More than three times a week   . Frequency of Social Gatherings with Friends and Family: More than three times a week   . Attends Religious Services: More than 4 times per year   . Active Member of Clubs or Organizations: No   . Attends Banker Meetings: Never   . Marital Status: Married  Catering manager Violence: Not At Risk (10/09/2022)   Humiliation, Afraid, Rape, and Kick questionnaire   . Fear of Current or Ex-Partner: No   . Emotionally Abused: No   . Physically Abused: No   . Sexually Abused: No    Outpatient Medications Prior to Visit  Medication Sig Dispense Refill  . albuterol (VENTOLIN HFA) 108 (90 Base) MCG/ACT inhaler Inhale 2 puffs into the lungs every 6 (six) hours as needed for wheezing or shortness of breath. 8 g 0  . amLODipine (NORVASC) 5 MG tablet Take 1 tablet (5 mg total) by mouth daily. 90 tablet 0  . atorvastatin (LIPITOR) 40 MG tablet Take 1 tablet (40 mg total) by mouth daily. 90 tablet 0  . azelastine (ASTELIN) 0.1 % nasal spray Place 2 sprays into both nostrils 2 (two) times daily. Use in each nostril as directed 30 mL 2  . cyclobenzaprine (FLEXERIL) 5 MG tablet Take 5 mg by mouth 3 (three) times daily as needed for muscle spasms.    Marland Kitchen estradiol (ESTRACE) 0.1 MG/GM vaginal cream SMARTSIG:Gram(s) Vaginal 3 Times a Week    . hydroxychloroquine (PLAQUENIL) 200 MG tablet TAKE 1 TABLET(200 MG) BY MOUTH TWICE DAILY 180 tablet 0  . omeprazole (PRILOSEC) 40 MG capsule Take 1 capsule (40 mg total) by mouth daily. 90 capsule 3  . nitrofurantoin, macrocrystal-monohydrate, (MACROBID) 100 MG capsule Take 1 capsule (100 mg total) by mouth 2 (two) times daily. 14 capsule 0    No facility-administered medications prior to visit.    No Known Allergies  Review of Systems  Constitutional:  Negative for appetite change, fatigue and fever.  HENT:  Negative for congestion, ear pain, sinus pressure and sore throat.   Respiratory:  Negative for cough, chest tightness, shortness of breath and wheezing.   Cardiovascular:  Negative for chest pain and palpitations.  Gastrointestinal:  Negative for abdominal pain, constipation, diarrhea, nausea and vomiting.  Genitourinary:  Negative for dysuria and hematuria.  Musculoskeletal:  Negative for arthralgias, back pain, joint swelling and myalgias.  Skin:  Negative for rash.  Neurological:  Negative for dizziness, weakness and headaches.  Psychiatric/Behavioral:  Negative for dysphoric mood. The patient is not nervous/anxious.        Objective:        10/18/2023    3:40 PM 05/26/2023  3:51 PM 03/12/2023    7:41 AM  Vitals with BMI  Height 5' 6.5" 5' 6.5" 5' 6.5"  Weight 200 lbs 13 oz 199 lbs 198 lbs  BMI 31.93 31.64 31.48  Systolic 138 122 778  Diastolic 84 82 72  Pulse 71 77 82    Orthostatic VS for the past 72 hrs (Last 3 readings):  Patient Position BP Location  10/18/23 1540 Sitting Left Arm     Physical Exam Vitals reviewed.  Constitutional:      Appearance: Normal appearance. She is obese.  Neck:     Vascular: No carotid bruit.  Cardiovascular:     Rate and Rhythm: Normal rate and regular rhythm.     Heart sounds: Normal heart sounds.  Pulmonary:     Effort: Pulmonary effort is normal. No respiratory distress.     Breath sounds: Normal breath sounds.  Abdominal:     General: Abdomen is flat. Bowel sounds are normal.     Palpations: Abdomen is soft.     Tenderness: There is no abdominal tenderness.  Musculoskeletal:     Comments: RIGHT KNEE EXAM Tender: negative Patellar apprehension: negative. Latera ligament laxity: negative McMurray's signs: negative Anterior drawer movement/Lachmans:  negative Posterior drawer movement: negative Some edema of upper lateral knee.  LEFT KNEE EXAM Tender: negative Patellar apprehension: negative. Latera ligament laxity: negative McMurray's signs: negative Anterior drawer movement/Lachmans: negative Posterior drawer movement: negative   Skin:    Comments: BL thickened great nails.  Ingrown toe nails. Discolored nails.  Neurological:     Mental Status: She is alert and oriented to person, place, and time.  Psychiatric:        Mood and Affect: Mood normal.        Behavior: Behavior normal.     Health Maintenance Due  Topic Date Due  . HIV Screening  Never done  . Hepatitis C Screening  Never done  . DTaP/Tdap/Td (1 - Tdap) Never done  . Zoster Vaccines- Shingrix (2 of 2) 10/29/2022  . INFLUENZA VACCINE  06/10/2023  . COVID-19 Vaccine (5 - 2023-24 season) 07/11/2023    There are no preventive care reminders to display for this patient.   Lab Results  Component Value Date   TSH 2.300 09/03/2022   Lab Results  Component Value Date   WBC 4.6 03/12/2023   HGB 12.3 03/12/2023   HCT 39.1 03/12/2023   MCV 96 03/12/2023   PLT 217 03/12/2023   Lab Results  Component Value Date   NA 142 04/02/2023   K 4.1 04/02/2023   CO2 24 04/02/2023   GLUCOSE 83 04/02/2023   BUN 15 04/02/2023   CREATININE 0.99 04/02/2023   BILITOT 0.8 04/02/2023   ALKPHOS 102 04/02/2023   AST 40 04/02/2023   ALT 62 (H) 04/02/2023   PROT 6.8 04/02/2023   ALBUMIN 4.4 04/02/2023   CALCIUM 9.4 04/02/2023   EGFR 64 04/02/2023   Lab Results  Component Value Date   CHOL 103 03/12/2023   Lab Results  Component Value Date   HDL 45 03/12/2023   Lab Results  Component Value Date   LDLCALC 45 03/12/2023   Lab Results  Component Value Date   TRIG 57 03/12/2023   Lab Results  Component Value Date   CHOLHDL 2.3 03/12/2023   No results found for: "HGBA1C"     Assessment & Plan:  There are no diagnoses linked to this encounter.   No  orders of the defined types were placed in  this encounter.   No orders of the defined types were placed in this encounter.    Assessment & Plan Salt Sensitivity Reports nausea, headaches, and frequent urination after consuming salt. Discussed the importance of moderating salt intake. -Avoid high salt foods and separate food preparation from partner's.  Ingrown Toenails Painful bilateral great toenails, likely secondary to improper nail trimming and possibly fungal infection. Discussed the need for nail removal to alleviate pain. -Schedule bilateral toenail removal on 10/20/2023.  Knee Pain History of old injury with recent flare of pain during cold weather, possibly due to arthritis or old meniscal tear. Pain improved with Aleve. -Continue Aleve as needed for pain. -Consider further evaluation with imaging and physical therapy if pain worsens or does not improve with weight loss.  Weight Management Expressed desire for assistance with weight loss. Discussed potential medication options. -Consider prescription weight loss medication, such as Contrave, if diet and exercise are insufficient.  Follow-up: Return in about 2 days (around 10/20/2023).  An After Visit Summary was printed and given to the patient.  I,Lauren M Auman,acting as a scribe for Blane Ohara, MD.,have documented all relevant documentation on the behalf of Blane Ohara, MD,as directed by  Blane Ohara, MD while in the presence of Blane Ohara, MD.   I attest that I have reviewed this visit and agree with the plan scribed by my staff.   Blane Ohara, MD [facility] [phone]

## 2023-10-20 ENCOUNTER — Encounter: Payer: Self-pay | Admitting: Family Medicine

## 2023-10-20 ENCOUNTER — Ambulatory Visit: Payer: BC Managed Care – PPO | Admitting: Family Medicine

## 2023-10-20 VITALS — BP 132/82 | HR 67 | Temp 98.7°F | Resp 16 | Ht 66.5 in | Wt 200.8 lb

## 2023-10-20 DIAGNOSIS — E6609 Other obesity due to excess calories: Secondary | ICD-10-CM

## 2023-10-20 DIAGNOSIS — G8929 Other chronic pain: Secondary | ICD-10-CM | POA: Insufficient documentation

## 2023-10-20 DIAGNOSIS — L600 Ingrowing nail: Secondary | ICD-10-CM

## 2023-10-20 DIAGNOSIS — E66811 Obesity, class 1: Secondary | ICD-10-CM

## 2023-10-20 DIAGNOSIS — Z6831 Body mass index (BMI) 31.0-31.9, adult: Secondary | ICD-10-CM | POA: Diagnosis not present

## 2023-10-20 HISTORY — DX: Ingrowing nail: L60.0

## 2023-10-20 HISTORY — DX: Other chronic pain: G89.29

## 2023-10-20 MED ORDER — CONTRAVE 8-90 MG PO TB12: 2.0000 | ORAL_TABLET | Freq: Two times a day (BID) | ORAL | 0 refills | Status: DC

## 2023-10-20 NOTE — Assessment & Plan Note (Signed)
History of old injury with recent flare of pain during cold weather, possibly due to arthritis or old meniscal tear. Pain improved with Aleve. -Continue Aleve as needed for pain. -Consider further evaluation with imaging and physical therapy if pain worsens or does not improve with weight loss.

## 2023-10-20 NOTE — Assessment & Plan Note (Signed)
Expressed desire for assistance with weight loss. Discussed potential medication options. -Consider prescription weight loss medication, such as Contrave, if diet and exercise are insufficient.

## 2023-10-20 NOTE — Patient Instructions (Signed)
Start contrave one tablet in am x 1 week, then increase to one twice daily x 1 week, then increase to 2 in am and one in pm x 1 week, then increase to 2 oral twice daily.   The prescription for contrave I sent will have directions for 2 oral twice daily. Do above instructions instead.   Keep compression dressing on toes overnight, then may remove and use antibiotic ointment and a bandaid.

## 2023-10-20 NOTE — Assessment & Plan Note (Signed)
Painful bilateral great toenails, likely secondary to improper nail trimming and possibly fungal infection. Discussed the need for nail removal to alleviate pain. -Schedule bilateral toenail removal on 10/20/2023.

## 2023-10-23 NOTE — Assessment & Plan Note (Signed)
Keep compression dressing on toes overnight, then may remove and use antibiotic ointment and a bandaid. Education given.

## 2023-10-23 NOTE — Assessment & Plan Note (Signed)
Start contrave one tablet in am x 1 week, then increase to one twice daily x 1 week, then increase to 2 in am and one in pm x 1 week, then increase to 2 oral twice daily.   The prescription for contrave I sent will have directions for 2 oral twice daily. Do above instructions instead.

## 2023-10-24 ENCOUNTER — Encounter: Payer: Self-pay | Admitting: Family Medicine

## 2023-10-27 ENCOUNTER — Ambulatory Visit: Payer: Self-pay | Admitting: Family Medicine

## 2023-10-27 NOTE — Telephone Encounter (Signed)
Pt requesting abx  Chief Complaint: swelling to toes, recent nail excised Symptoms: swelling, 3/10 pain Frequency: 2 days Pertinent Negatives: Patient denies fever, denies redness, denies DM II, denies discharge  Disposition: [] ED /[] Urgent Care (no appt availability in office) / [x] Appointment(In office/virtual)/ []  [facility]/ [] Home Care/ [] Refused Recommended Disposition /[] [facility]/ []  Follow-up with PCP Additional Notes: Pt requesting abx.  Advised to call back if s/s worsen. Pt sched for 12/20  Copied from CRM #[id number]. Topic: Clinical - Medical Advice >> Oct 27, 2023  3:50 PM Geroge Baseman wrote: Reason for CRM: Patient states she has swelling and may need an antibiotic Reason for Disposition  Localized redness and swelling of skin around nail (i.e., cuticle area or nail fold)  Answer Assessment - Initial Assessment Questions 1. ONSET: "When did the pain start?"      Where pt had 2 toe nails excised. 3. PAIN: "How bad is the pain?"    (Scale 1-10; or mild, moderate, severe)   -  MILD (1-3): doesn't interfere with normal activities    -  MODERATE (4-7): interferes with normal activities (e.g., work or school) or awakens from sleep, limping    -  SEVERE (8-10): excruciating pain, unable to do any normal activities, unable to walk     3 4. APPEARANCE: "What does the toe look like?" (e.g., redness, swelling, bruising, pallor)     swelling 5. CAUSE: "What do you think is causing the toe pain?"     Recent excision of 2 toenails 6. OTHER SYMPTOMS: "Do you have any other symptoms?" (e.g., leg pain, rash, fever, numbness)     No fever, denies rash  Protocols used: Toe Pain-A-AH

## 2023-10-28 ENCOUNTER — Other Ambulatory Visit: Payer: Self-pay | Admitting: Family Medicine

## 2023-10-28 ENCOUNTER — Encounter: Payer: Self-pay | Admitting: Family Medicine

## 2023-10-28 MED ORDER — CEPHALEXIN 500 MG PO CAPS
500.0000 mg | ORAL_CAPSULE | Freq: Two times a day (BID) | ORAL | 0 refills | Status: DC
Start: 2023-10-28 — End: 2023-11-24

## 2023-10-29 ENCOUNTER — Ambulatory Visit: Payer: BC Managed Care – PPO | Admitting: Family Medicine

## 2023-11-04 ENCOUNTER — Ambulatory Visit: Payer: BC Managed Care – PPO | Admitting: Family Medicine

## 2023-11-11 ENCOUNTER — Other Ambulatory Visit: Payer: Self-pay | Admitting: Family Medicine

## 2023-11-11 DIAGNOSIS — M329 Systemic lupus erythematosus, unspecified: Secondary | ICD-10-CM

## 2023-11-11 MED ORDER — HYDROXYCHLOROQUINE SULFATE 200 MG PO TABS: ORAL_TABLET | ORAL | 0 refills | Status: DC

## 2023-11-11 NOTE — Telephone Encounter (Signed)
 Copied from CRM [id number]. Topic: Clinical - Medication Refill >> Nov 11, 2023 11:57 AM Antonio DEL wrote: Most Recent Primary Care Visit:  Provider: [facility]  Department[facility]  Visit Type: OFFICE VISIT  Date: 10/20/2023  Medication: hydroxychloroquine  (PLAQUENIL )  Has the patient contacted their pharmacy? Yes (Agent: If no, request that the patient contact the pharmacy for the refill. If patient does not wish to contact the pharmacy document the reason why and proceed with request.) (Agent: If yes, when and what did the pharmacy advise?)  Is this the correct pharmacy for this prescription? Yes If no, delete pharmacy and type the correct one.  This is the patient's preferred pharmacy:  [facility] [phone] - [place], [place] - [phone] AT [facility] [address] & [address] [place] [address] Phone: [phone] Fax: [phone]    Has the prescription been filled recently?   Is the patient out of the medication?   Has the patient been seen for an appointment in the last year OR does the patient have an upcoming appointment?   Can we respond through MyChart?   Agent: Please be advised that Rx refills may take up to 3 business days. We ask that you follow-up with your pharmacy.

## 2023-11-24 ENCOUNTER — Ambulatory Visit (INDEPENDENT_AMBULATORY_CARE_PROVIDER_SITE_OTHER): Payer: 59 | Admitting: Family Medicine

## 2023-11-24 VITALS — BP 136/80 | HR 87 | Temp 97.6°F | Ht 66.5 in | Wt 203.0 lb

## 2023-11-24 DIAGNOSIS — L608 Other nail disorders: Secondary | ICD-10-CM

## 2023-11-24 HISTORY — DX: Other nail disorders: L60.8

## 2023-11-24 NOTE — Progress Notes (Signed)
 Subjective:  Patient ID: Kathy Villa, female    DOB: 24-Aug-1961  Age: 63 y.o. MRN: [id number]  Chief Complaint  Patient presents with   6 week follow up    HPI   Patient presents today for 6 week follow up on her in grown toenails being removed. At times toes are tender. Questions why the nail looks crusty it is hard.  No fevers.  She initially had some of them following the removal but has not recently.  Patient was also seen for weight management previously but the Contrave  is very expensive and she does not want to take this.  She is eating healthier.  She is currently on the [place] fast.     11/24/2023    1:30 PM 10/09/2022    8:08 AM 04/16/2022    9:03 AM 09/01/2020    9:28 PM 04/03/2020    8:25 AM  Depression screen PHQ 2/9  Decreased Interest 0 0 0 0 0  Down, Depressed, Hopeless 0 0 0 1 0  PHQ - 2 Score 0 0 0 1 0  Altered sleeping 0 0  1   Tired, decreased energy 0 0  1   Change in appetite 0 0  0   Feeling bad or failure about yourself  0 0  1   Trouble concentrating 0 0  1   Moving slowly or fidgety/restless 0 0  1   Suicidal thoughts 0 0  0   PHQ-9 Score 0 0  6   Difficult doing work/chores Not difficult at all Not difficult at all           02/25/2023   12:04 PM  Fall Risk   Falls in the past year? 0  Number falls in past yr: 0  Injury with Fall? 0  Risk for fall due to : No Fall Risks  Follow up Falls evaluation completed    Patient Care Team: Mercy Stall, MD as PCP - General (Family Medicine)   Review of Systems  Constitutional:  Negative for chills, fatigue and fever.  HENT:  Negative for congestion, ear pain, rhinorrhea and sore throat.   Respiratory:  Negative for cough and shortness of breath.   Cardiovascular:  Negative for chest pain.  Gastrointestinal:  Negative for abdominal pain, constipation, diarrhea, nausea and vomiting.  Genitourinary:  Negative for dysuria and urgency.  Musculoskeletal:  Negative for back pain and myalgias.   Neurological:  Negative for dizziness, weakness, light-headedness and headaches.  Psychiatric/Behavioral:  Negative for dysphoric mood. The patient is not nervous/anxious.     Current Outpatient Medications on File Prior to Visit  Medication Sig Dispense Refill   albuterol  (VENTOLIN  HFA) 108 (90 Base) MCG/ACT inhaler Inhale 2 puffs into the lungs every 6 (six) hours as needed for wheezing or shortness of breath. 8 g 0   amLODipine  (NORVASC ) 5 MG tablet Take 1 tablet (5 mg total) by mouth daily. 90 tablet 0   atorvastatin  (LIPITOR) 40 MG tablet Take 1 tablet (40 mg total) by mouth daily. 90 tablet 0   azelastine  (ASTELIN ) 0.1 % nasal spray Place 2 sprays into both nostrils 2 (two) times daily. Use in each nostril as directed 30 mL 2   cyclobenzaprine  (FLEXERIL ) 5 MG tablet Take 5 mg by mouth 3 (three) times daily as needed for muscle spasms.     estradiol (ESTRACE) 0.1 MG/GM vaginal cream SMARTSIG:Gram(s) Vaginal 3 Times a Week     hydroxychloroquine  (PLAQUENIL ) 200 MG tablet TAKE 1 TABLET(200  MG) BY MOUTH TWICE DAILY 180 tablet 0   omeprazole  (PRILOSEC) 40 MG capsule Take 1 capsule (40 mg total) by mouth daily. 90 capsule 3   No current facility-administered medications on file prior to visit.   Past Medical History:  Diagnosis Date   Attention deficit hyperactivity disorder, predominantly inattentive type    prefers no medicines.   Cough 11/07/2020   Dizziness and giddiness 01/14/2020   Encounter for immunization 08/28/2020   Essential (primary) hypertension 2016   Essential hypertension, benign 08/28/2020   Fatigue 11/07/2020   Insomnia 01/14/2020   Left-sided headache 01/14/2020   Migraine with aura, intractable, without status migrainosus 1980   Mixed hyperlipidemia 08/28/2020   Other specified nontoxic goiter    Overweight    Paresthesias 01/14/2020   SLE (systemic lupus erythematosus related syndrome) (HCC) 01/14/2020   Systemic lupus erythematosus, organ or system involvement  unspecified (HCC) 2006   Past Surgical History:  Procedure Laterality Date   CESAREAN SECTION      Family History  Problem Relation Age of Onset   Congestive Heart Failure Father    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  Tobacco Use   Smoking status: Former    Current packs/day: 0.00    Types: Cigarettes    Quit date: 11/09/1982    Years since quitting: 41.0   Smokeless tobacco: Never  Vaping Use   Vaping status: Never Used  Substance and Sexual Activity   Alcohol use: Not Currently   Drug use: Never   Sexual activity: Yes  Other Topics Concern   Not on file  Social History Narrative   Right handed   Lives with husband   Social Drivers of Health   Financial Resource Strain: Low Risk  (10/09/2022)   Overall Financial Resource Strain (CARDIA)    Difficulty of Paying Living Expenses: Not hard at all  Food Insecurity: No Food Insecurity (10/09/2022)   Hunger Vital Sign    Worried About Running Out of Food in the Last Year: Never true    Ran Out of Food in the Last Year: Never true  Transportation Needs: No Transportation Needs (10/09/2022)   PRAPARE - Administrator, Civil Service (Medical): No    Lack of Transportation (Non-Medical): No  Physical Activity: Insufficiently Active (10/09/2022)   Exercise Vital Sign    Days of Exercise per Week: 3 days    Minutes of Exercise per Session: 20 min  Stress: No Stress Concern Present (10/09/2022)   [employer] of Occupational Health - Occupational Stress Questionnaire    Feeling of Stress : Not at all  Social Connections: Moderately Integrated (10/09/2022)   Social Connection and Isolation Panel [NHANES]    Frequency of Communication with Friends and Family: More than three  times a week    Frequency of Social Gatherings with Friends and Family: More than three times a week    Attends Religious Services: More than 4 times per year    Active Member of Clubs or Organizations: No    Attends Banker Meetings: Never    Marital Status: Married    Objective:  BP 136/80   Pulse 87   Temp 97.6 F (36.4 C)   Ht  5' 6.5" (1.689 m)   Wt 203 lb (92.1 kg)   SpO2 98%   BMI 32.27 kg/m      11/24/2023    1:17 PM 10/20/2023    1:37 PM 10/18/2023    3:40 PM  BP/Weight  Systolic BP 136 132 138  Diastolic BP 80 82 84  Wt. (Lbs) 203 200.8 200.8  BMI 32.27 kg/m2 31.92 kg/m2 31.92 kg/m2    Physical Exam Vitals reviewed.  Constitutional:      Appearance: Normal appearance.  Skin:    Comments: Bilateral great toenails: Bed of the nail is hard bilaterally almost appears like there is some nail formation however there is also a ridge at the base of each nail where the actual nail appears to be growing.  Minimally tender.  Neurological:     Mental Status: She is alert.     Diabetic Foot Exam - Simple   No data filed      Lab Results  Component Value Date   WBC 4.6 03/12/2023   HGB 12.3 03/12/2023   HCT 39.1 03/12/2023   PLT 217 03/12/2023   GLUCOSE 83 04/02/2023   CHOL 103 03/12/2023   TRIG 57 03/12/2023   HDL 45 03/12/2023   LDLCALC 45 03/12/2023   ALT 62 (H) 04/02/2023   AST 40 04/02/2023   NA 142 04/02/2023   K 4.1 04/02/2023   CL 103 04/02/2023   CREATININE 0.99 04/02/2023   BUN 15 04/02/2023   CO2 24 04/02/2023   TSH 2.300 09/03/2022      Assessment & Plan:    Acquired deformity of toenail Assessment & Plan: Refer to podiatry for evaluation.  Appears concerning for a nail growing over another nail however both nails were fully removed 6 weeks ago.  Orders: -     Ambulatory referral to Podiatry     No orders of the defined types were placed in this encounter.   Orders Placed This Encounter  Procedures   Ambulatory  referral to Podiatry     Follow-up: No follow-ups on file.   I,Jayveon Convey A Juanmanuel Marohl,acting as a scribe for Mercy Stall, MD.,have documented all relevant documentation on the behalf of Mercy Stall, MD,as directed by  Mercy Stall, MD while in the presence of Mercy Stall, MD.   An After Visit Summary was printed and given to the patient.  Mercy Stall, MD [facility] [phone]

## 2023-11-24 NOTE — Assessment & Plan Note (Signed)
 Refer to podiatry for evaluation.  Appears concerning for a nail growing over another nail however both nails were fully removed 6 weeks ago.

## 2023-12-02 ENCOUNTER — Ambulatory Visit: Payer: 59 | Admitting: Podiatry

## 2023-12-02 LAB — LAB REPORT - SCANNED
Creatinine, POC: 62.7 mg/dL
EGFR: 60

## 2023-12-09 ENCOUNTER — Encounter: Payer: Self-pay | Admitting: Podiatry

## 2023-12-09 ENCOUNTER — Ambulatory Visit: Payer: 59 | Admitting: Podiatry

## 2023-12-09 DIAGNOSIS — L600 Ingrowing nail: Secondary | ICD-10-CM

## 2023-12-09 NOTE — Progress Notes (Signed)
Subjective:   Patient ID: Kathy Villa, female   DOB: 63 y.o.   MRN: [id number]   HPI Patient presents stating that she went to her family doctor any partially removed her big toenails and they have been chronically like this and her father had the same condition and they have been thick and damaged.  Patient does not smoke likes to be active   Review of Systems  All other systems reviewed and are negative.       Objective:  Physical Exam Vitals and nursing note reviewed.  Constitutional:      Appearance: She is well-developed.  Pulmonary:     Effort: Pulmonary effort is normal.  Musculoskeletal:        General: Normal range of motion.  Skin:    General: Skin is warm.  Neurological:     Mental Status: She is alert.     Neurovascular status intact muscle strength found to be adequate range of motion adequate with nail disease of the big toenails both feet that have been traumatized and partially removed     Assessment:  Damaged nail both feet that are partially removed and may grow back abnormally and may ultimately require permanent removal     Plan:  H&P done and I did discuss the possibility for permanent removal of the nailbeds but at this point I cleaned up the nails and smoothed them and patient will be seen back to recheck after they have regrown and may require procedure for permanent removal which I made her aware of today depending on how they do

## 2024-01-13 ENCOUNTER — Ambulatory Visit: Admitting: Physician Assistant

## 2024-01-13 ENCOUNTER — Ambulatory Visit: Payer: Self-pay | Admitting: Family Medicine

## 2024-01-13 NOTE — Progress Notes (Deleted)
 Acute Office Visit  Subjective:    Patient ID: Kathy Villa, female    DOB: Dec 20, 1960, 63 y.o.   MRN: [id number]  No chief complaint on file.   Discussed the use of AI scribe software for clinical note transcription with the patient, who gave verbal consent to proceed.      HPI: Patient is in today with concerns of her heart racing over the past three weeks. Pt states it will sometimes wake her up from sleep. Pt occasionally has chest tightness and has noticed herself getting more winded with activity, also states she notice it happen more when she is watching the news.  Past Medical History:  Diagnosis Date  . Attention deficit hyperactivity disorder, predominantly inattentive type    prefers no medicines.  . Cough 11/07/2020  . Dizziness and giddiness 01/14/2020  . Encounter for immunization 08/28/2020  . Essential (primary) hypertension 2016  . Essential hypertension, benign 08/28/2020  . Fatigue 11/07/2020  . Insomnia 01/14/2020  . Left-sided headache 01/14/2020  . Migraine with aura, intractable, without status migrainosus 1980  . Mixed hyperlipidemia 08/28/2020  . Other specified nontoxic goiter   . Overweight   . Paresthesias 01/14/2020  . SLE (systemic lupus erythematosus related syndrome) (HCC) 01/14/2020  . Systemic lupus erythematosus, organ or system involvement unspecified (HCC) 2006    Past Surgical History:  Procedure Laterality Date  . CESAREAN SECTION      Family History  Problem Relation Age of Onset  . Congestive Heart Failure Father     Social History   Socioeconomic History  . Marital status: Married    Spouse name: Not on file  . Number of children: 3  . Years of education: Not on file  . Highest education level: Not on file  Occupational History  . Occupation: Energy manager of [employer]  Tobacco Use  . Smoking status: Former    Current packs/day: 0.00    Types: Cigarettes    Quit date: 11/09/1982    Years since  quitting: 41.2  . Smokeless tobacco: Never  Vaping Use  . Vaping status: Never Used  Substance and Sexual Activity  . Alcohol use: Not Currently  . Drug use: Never  . Sexual activity: Yes  Other Topics Concern  . Not on file  Social History Narrative   Right handed   Lives with husband   Social Drivers of Health   Financial Resource Strain: Low Risk  (10/09/2022)   Overall Financial Resource Strain (CARDIA)   . Difficulty of Paying Living Expenses: Not hard at all  Food Insecurity: No Food Insecurity (10/09/2022)   Hunger Vital Sign   . Worried About Programme researcher, broadcasting/film/video in the Last Year: Never true   . Ran Out of Food in the Last Year: Never true  Transportation Needs: No Transportation Needs (10/09/2022)   PRAPARE - Transportation   . Lack of Transportation (Medical): No   . Lack of Transportation (Non-Medical): No  Physical Activity: Insufficiently Active (10/09/2022)   Exercise Vital Sign   . Days of Exercise per Week: 3 days   . Minutes of Exercise per Session: 20 min  Stress: No Stress Concern Present (10/09/2022)   [employer] of Occupational Health - Occupational Stress Questionnaire   . Feeling of Stress : Not at all  Social Connections: Moderately Integrated (10/09/2022)   Social Connection and Isolation Panel [NHANES]   . Frequency of Communication with Friends and Family: More than three times a week   .  Frequency of Social Gatherings with Friends and Family: More than three times a week   . Attends Religious Services: More than 4 times per year   . Active Member of Clubs or Organizations: No   . Attends Banker Meetings: Never   . Marital Status: Married  Catering manager Violence: Not At Risk (10/09/2022)   Humiliation, Afraid, Rape, and Kick questionnaire   . Fear of Current or Ex-Partner: No   . Emotionally Abused: No   . Physically Abused: No   . Sexually Abused: No    Outpatient Medications Prior to Visit  Medication Sig Dispense Refill   . albuterol (VENTOLIN HFA) 108 (90 Base) MCG/ACT inhaler Inhale 2 puffs into the lungs every 6 (six) hours as needed for wheezing or shortness of breath. 8 g 0  . amLODipine (NORVASC) 5 MG tablet Take 1 tablet (5 mg total) by mouth daily. 90 tablet 0  . atorvastatin (LIPITOR) 40 MG tablet Take 1 tablet (40 mg total) by mouth daily. 90 tablet 0  . azelastine (ASTELIN) 0.1 % nasal spray Place 2 sprays into both nostrils 2 (two) times daily. Use in each nostril as directed 30 mL 2  . cyclobenzaprine (FLEXERIL) 5 MG tablet Take 5 mg by mouth 3 (three) times daily as needed for muscle spasms.    Marland Kitchen estradiol (ESTRACE) 0.1 MG/GM vaginal cream SMARTSIG:Gram(s) Vaginal 3 Times a Week    . hydroxychloroquine (PLAQUENIL) 200 MG tablet TAKE 1 TABLET(200 MG) BY MOUTH TWICE DAILY 180 tablet 0  . omeprazole (PRILOSEC) 40 MG capsule Take 1 capsule (40 mg total) by mouth daily. 90 capsule 3   No facility-administered medications prior to visit.    No Known Allergies  Review of Systems  Constitutional:  Negative for fatigue.  HENT:  Negative for congestion, ear pain and sore throat.   Respiratory:  Negative for cough and shortness of breath.   Cardiovascular:  Negative for chest pain.  Gastrointestinal:  Negative for abdominal pain, constipation, diarrhea, nausea and vomiting.  Genitourinary:  Negative for dysuria, frequency and urgency.  Musculoskeletal:  Negative for arthralgias, back pain and myalgias.  Neurological:  Negative for dizziness and headaches.  Psychiatric/Behavioral:  Negative for agitation and sleep disturbance. The patient is nervous/anxious.       Objective:        11/24/2023    1:17 PM 10/20/2023    1:37 PM 10/18/2023    3:40 PM  Vitals with BMI  Height 5' 6.5" 5' 6.5" 5' 6.5"  Weight 203 lbs 200 lbs 13 oz 200 lbs 13 oz  BMI 32.28 31.93 31.93  Systolic 136 132 045  Diastolic 80 82 84  Pulse 87 67 71    No data found.   Physical Exam  Health Maintenance Due  Topic  Date Due  . HIV Screening  Never done  . Hepatitis C Screening  Never done  . DTaP/Tdap/Td (1 - Tdap) Never done  . Zoster Vaccines- Shingrix (2 of 2) 10/29/2022  . COVID-19 Vaccine (5 - 2024-25 season) 07/11/2023    There are no preventive care reminders to display for this patient.   Lab Results  Component Value Date   TSH 2.300 09/03/2022   Lab Results  Component Value Date   WBC 4.6 03/12/2023   HGB 12.3 03/12/2023   HCT 39.1 03/12/2023   MCV 96 03/12/2023   PLT 217 03/12/2023   Lab Results  Component Value Date   NA 142 04/02/2023   K 4.1 04/02/2023  CO2 24 04/02/2023   GLUCOSE 83 04/02/2023   BUN 15 04/02/2023   CREATININE 0.99 04/02/2023   BILITOT 0.8 04/02/2023   ALKPHOS 102 04/02/2023   AST 40 04/02/2023   ALT 62 (H) 04/02/2023   PROT 6.8 04/02/2023   ALBUMIN 4.4 04/02/2023   CALCIUM 9.4 04/02/2023   EGFR 60.0 12/02/2023   Lab Results  Component Value Date   CHOL 103 03/12/2023   Lab Results  Component Value Date   HDL 45 03/12/2023   Lab Results  Component Value Date   LDLCALC 45 03/12/2023   Lab Results  Component Value Date   TRIG 57 03/12/2023   Lab Results  Component Value Date   CHOLHDL 2.3 03/12/2023   No results found for: "HGBA1C"     Assessment & Plan:  There are no diagnoses linked to this encounter.   Assessment and Plan       No orders of the defined types were placed in this encounter.   No orders of the defined types were placed in this encounter.    Follow-up: No follow-ups on file.  An After Visit Summary was printed and given to the patient.  Langley Gauss, [place] [facility] [phone]

## 2024-01-13 NOTE — Telephone Encounter (Signed)
 Chief Complaint: Heart racing Symptoms: Elevated HR even at rest, tightness in chest sometimes, winded with activity Frequency: Intermittent Pertinent Negatives: Patient denies dizziness, sweating Disposition: [] ED /[] Urgent Care (no appt availability in office) / [x] Appointment(In office/virtual)/ []  [facility]/ [] Home Care/ [] Refused Recommended Disposition /[]  Mobile Bus/ []  Follow-up with PCP Additional Notes: Pt states she has noticed her heart racing over the past three weeks. Pt states it will sometimes wake her up from sleep. Pt occasionally has chest tightness and has noticed herself getting more winded with activity. Appointment scheduled for today. This RN educated pt on home care, new-worsening symptoms, when to call back/seek emergent care. Pt verbalized understanding and agrees to plan.     Copied from CRM [id number]. Topic: Clinical - Red Word Triage >> Jan 13, 2024 10:56 AM Macon Large wrote: Red Word that prompted transfer to Nurse Triage: heart palpitations Reason for Disposition  [1] Heart beating very rapidly (e.g., > 140 / minute) AND [2] not present now  (Exception: During exercise.)  Answer Assessment - Initial Assessment Questions 1. DESCRIPTION: "Please describe your heart rate or heartbeat that you are having" (e.g., fast/slow, regular/irregular, skipped or extra beats, "palpitations")     Heart beats really fast even when lying down, sometimes wakes pt up at night 2. ONSET: "When did it start?" (Minutes, hours or days)      Noticed about 3 weeks ago 3. DURATION: "How long does it last" (e.g., seconds, minutes, hours)     Varies; a few minutes 4. PATTERN "Does it come and go, or has it been constant since it started?"  "Does it get worse with exertion?"   "Are you feeling it now?"     Intermittent, notices it when under stress, watching the news 5. HEART RATE: "Can you tell me your heart rate?" "How many beats in 15 seconds?"  (Note: not all  patients can do this)       Can not 6. RECURRENT SYMPTOM: "Have you ever had this before?" If Yes, ask: "When was the last time?" and "What happened that time?"      Had it before but not this long/consistent 7. CAUSE: "What do you think is causing the palpitations?"     Not sure if its stress 8. CARDIAC HISTORY: "Do you have any history of heart disease?" (e.g., heart attack, angina, bypass surgery, angioplasty, arrhythmia)      Denies 9. OTHER SYMPTOMS: "Do you have any other symptoms?" (e.g., dizziness, chest pain, sweating, difficulty breathing)       Chest tightness, winded with activity  Protocols used: Heart Rate and Heartbeat Questions-A-AH

## 2024-01-14 ENCOUNTER — Ambulatory Visit: Admitting: Physician Assistant

## 2024-01-14 ENCOUNTER — Encounter: Payer: Self-pay | Admitting: Physician Assistant

## 2024-01-14 VITALS — BP 132/70 | HR 76 | Temp 98.3°F | Resp 16 | Ht 66.5 in | Wt 204.2 lb

## 2024-01-14 DIAGNOSIS — R0789 Other chest pain: Secondary | ICD-10-CM | POA: Diagnosis not present

## 2024-01-14 DIAGNOSIS — R002 Palpitations: Secondary | ICD-10-CM | POA: Diagnosis not present

## 2024-01-14 DIAGNOSIS — E782 Mixed hyperlipidemia: Secondary | ICD-10-CM | POA: Diagnosis not present

## 2024-01-14 DIAGNOSIS — I10 Essential (primary) hypertension: Secondary | ICD-10-CM

## 2024-01-14 MED ORDER — METOPROLOL TARTRATE 25 MG PO TABS
12.5000 mg | ORAL_TABLET | Freq: Two times a day (BID) | ORAL | 3 refills | Status: AC
Start: 2024-01-14 — End: ?

## 2024-01-14 MED ORDER — AMLODIPINE BESYLATE 5 MG PO TABS
5.0000 mg | ORAL_TABLET | Freq: Every day | ORAL | 0 refills | Status: DC
Start: 2024-01-14 — End: 2024-01-25

## 2024-01-14 MED ORDER — NITROGLYCERIN 0.4 MG SL SUBL: 0.4000 mg | SUBLINGUAL_TABLET | SUBLINGUAL | 0 refills | Status: AC | PRN

## 2024-01-14 NOTE — Progress Notes (Signed)
 Acute Office Visit  Subjective:    Patient ID: Kathy Villa, female    DOB: 1960/12/08, 63 y.o.   MRN: [id number]  Chief Complaint  Patient presents with   Palpitations    Discussed the use of AI scribe software for clinical note transcription with the patient, who gave verbal consent to proceed.      HPI: Patient is in today with concerns of her heart racing over the past three weeks. Pt states it will sometimes wake her up from sleep. Pt occasionally has chest tightness and has noticed herself getting more winded with activity, also states she notice it happen more when she is watching the news.  Discussed the use of AI scribe software for clinical note transcription with the patient, who gave verbal consent to proceed.  History of Present Illness   The patient, with a history of hypertension managed with amlodipine, presents with palpitations and chest tightness. The palpitations are described as random and can be triggered by stress, such as watching the news. The patient also experiences episodes of chest tightness, particularly at night, which feels like her heart is racing. These symptoms occur daily and have been ongoing for the past month. The patient manages these symptoms by trying to calm down and praying. The patient denies any associated pain, nausea, or vomiting. The patient also reports a need for a prescription refill for amlodipine and a weight loss medication.       Past Medical History:  Diagnosis Date   Attention deficit hyperactivity disorder, predominantly inattentive type    prefers no medicines.   Cough 11/07/2020   Dizziness and giddiness 01/14/2020   Encounter for immunization 08/28/2020   Essential (primary) hypertension 2016   Essential hypertension, benign 08/28/2020   Fatigue 11/07/2020   Insomnia 01/14/2020   Left-sided headache 01/14/2020   Migraine with aura, intractable, without status migrainosus 1980   Mixed hyperlipidemia 08/28/2020    Other specified nontoxic goiter    Overweight    Paresthesias 01/14/2020   SLE (systemic lupus erythematosus related syndrome) (HCC) 01/14/2020   Systemic lupus erythematosus, organ or system involvement unspecified (HCC) 2006    Past Surgical History:  Procedure Laterality Date   CESAREAN SECTION      Family History  Problem Relation Age of Onset   Congestive Heart Failure Father     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  Tobacco Use   Smoking status: Former    Current packs/day: 0.00    Types: Cigarettes    Quit date: 11/09/1982    Years since quitting: 41.2   Smokeless tobacco: Never  Vaping Use   Vaping status: Never Used  Substance and Sexual Activity   Alcohol use: Not Currently   Drug use: Never   Sexual activity: Yes  Other Topics Concern   Not on file  Social History Narrative   Right handed   Lives with husband   Social Drivers of Health   Financial Resource Strain: Low Risk  (10/09/2022)   Overall Financial Resource Strain (CARDIA)    Difficulty of Paying Living Expenses: Not hard at all  Food Insecurity: No Food Insecurity (10/09/2022)   Hunger Vital Sign    Worried About Running Out  of Food in the Last Year: Never true    Ran Out of Food in the Last Year: Never true  Transportation Needs: No Transportation Needs (10/09/2022)   PRAPARE - Administrator, Civil Service (Medical): No    Lack of Transportation (Non-Medical): No  Physical Activity: Insufficiently Active (10/09/2022)   Exercise Vital Sign    Days of Exercise per Week: 3 days    Minutes of Exercise per Session: 20 min  Stress: No Stress Concern Present (10/09/2022)   [employer] of Occupational Health - Occupational Stress Questionnaire     Feeling of Stress : Not at all  Social Connections: Moderately Integrated (10/09/2022)   Social Connection and Isolation Panel [NHANES]    Frequency of Communication with Friends and Family: More than three times a week    Frequency of Social Gatherings with Friends and Family: More than three times a week    Attends Religious Services: More than 4 times per year    Active Member of [employer] or Organizations: No    Attends Banker Meetings: Never    Marital Status: Married  Catering manager Violence: Not At Risk (10/09/2022)   Humiliation, Afraid, Rape, and Kick questionnaire    Fear of Current or Ex-Partner: No    Emotionally Abused: No    Physically Abused: No    Sexually Abused: No    Outpatient Medications Prior to Visit  Medication Sig Dispense Refill   albuterol (VENTOLIN HFA) 108 (90 Base) MCG/ACT inhaler Inhale 2 puffs into the lungs every 6 (six) hours as needed for wheezing or shortness of breath. 8 g 0   atorvastatin (LIPITOR) 40 MG tablet Take 1 tablet (40 mg total) by mouth daily. 90 tablet 0   azelastine (ASTELIN) 0.1 % nasal spray Place 2 sprays into both nostrils 2 (two) times daily. Use in each nostril as directed 30 mL 2   cyclobenzaprine (FLEXERIL) 5 MG tablet Take 5 mg by mouth 3 (three) times daily as needed for muscle spasms.     estradiol (ESTRACE) 0.1 MG/GM vaginal cream SMARTSIG:Gram(s) Vaginal 3 Times a Week     hydroxychloroquine (PLAQUENIL) 200 MG tablet TAKE 1 TABLET(200 MG) BY MOUTH TWICE DAILY 180 tablet 0   omeprazole (PRILOSEC) 40 MG capsule Take 1 capsule (40 mg total) by mouth daily. 90 capsule 3   amLODipine (NORVASC) 5 MG tablet Take 1 tablet (5 mg total) by mouth daily. 90 tablet 0   No facility-administered medications prior to visit.    No Known Allergies  Review of Systems  Constitutional:  Negative for appetite change, fatigue and fever.  HENT:  Negative for congestion, ear pain, sinus pressure and sore throat.   Eyes: Negative.    Respiratory:  Positive for chest tightness. Negative for cough, shortness of breath and wheezing.   Cardiovascular:  Positive for palpitations. Negative for chest pain.  Gastrointestinal:  Negative for abdominal pain, constipation, diarrhea, nausea and vomiting.  Endocrine: Negative.   Genitourinary:  Negative for dysuria, frequency, hematuria and urgency.  Musculoskeletal:  Negative for arthralgias, back pain, joint swelling and myalgias.  Skin:  Negative for rash.  Neurological:  Negative for dizziness, weakness, light-headedness and headaches.  Psychiatric/Behavioral:  Negative for agitation, dysphoric mood and sleep disturbance. The patient is not nervous/anxious.        Objective:        01/14/2024    7:42 AM 11/24/2023    1:17 PM 10/20/2023    1:37 PM  Vitals with  BMI  Height 5' 6.5" 5' 6.5" 5' 6.5"  Weight 204 lbs 3 oz 203 lbs 200 lbs 13 oz  BMI 32.47 32.28 31.93  Systolic 132 136 657  Diastolic 70 80 82  Pulse 76 87 67    No data found.   Physical Exam Vitals reviewed.  Constitutional:      Appearance: Normal appearance.  Cardiovascular:     Rate and Rhythm: Normal rate and regular rhythm.     Heart sounds: Normal heart sounds.  Pulmonary:     Effort: Pulmonary effort is normal.     Breath sounds: Normal breath sounds.  Abdominal:     General: Bowel sounds are normal.     Palpations: Abdomen is soft.     Tenderness: There is no abdominal tenderness.  Neurological:     Mental Status: She is alert and oriented to person, place, and time.  Psychiatric:        Mood and Affect: Mood normal.        Behavior: Behavior normal.     Health Maintenance Due  Topic Date Due   HIV Screening  Never done   Hepatitis C Screening  Never done   DTaP/Tdap/Td (1 - Tdap) Never done   Zoster Vaccines- Shingrix (2 of 2) 10/29/2022   COVID-19 Vaccine (5 - 2024-25 season) 07/11/2023    There are no preventive care reminders to display for this patient.   Lab Results   Component Value Date   TSH 2.480 01/14/2024   Lab Results  Component Value Date   WBC 4.8 01/14/2024   HGB 13.0 01/14/2024   HCT 40.1 01/14/2024   MCV 95 01/14/2024   PLT 155 01/14/2024   Lab Results  Component Value Date   NA 142 01/14/2024   K 3.9 01/14/2024   CO2 25 01/14/2024   GLUCOSE 81 01/14/2024   BUN 17 01/14/2024   CREATININE 0.95 01/14/2024   BILITOT 0.5 01/14/2024   ALKPHOS 94 01/14/2024   AST 25 01/14/2024   ALT 28 01/14/2024   PROT 7.6 01/14/2024   ALBUMIN 4.8 01/14/2024   CALCIUM 9.8 01/14/2024   EGFR 67 01/14/2024   Lab Results  Component Value Date   CHOL 153 01/14/2024   Lab Results  Component Value Date   HDL 52 01/14/2024   Lab Results  Component Value Date   LDLCALC 87 01/14/2024   Lab Results  Component Value Date   TRIG 70 01/14/2024   Lab Results  Component Value Date   CHOLHDL 2.9 01/14/2024   Lab Results  Component Value Date   HGBA1C CANCELED 01/14/2024       Assessment & Plan:  Chest tightness Assessment & Plan: Intermittent palpitations and chest tightness, possibly anxiety-induced or cardiac-related. EKG shows minor changes, not immediately concerning. - Order stat labs: troponin, CBC, kidney and liver function, thyroid function tests. - Prescribe metoprolol, half tablet twice daily. - Prescribe nitroglycerin for chest pain >10 minutes. - Prescribe baby aspirin. - Instruct urgent care if chest tightness >30 minutes. - Instruct ER if chest pain >10 minutes. - Advise minimizing stressors. - Recommend cardiology follow-up if labs normal and symptoms persist.  Orders: -     amLODIPine Besylate; Take 1 tablet (5 mg total) by mouth daily.  Dispense: 90 tablet; Refill: 0 -     Metoprolol Tartrate; Take 0.5 tablets (12.5 mg total) by mouth 2 (two) times daily.  Dispense: 180 tablet; Refill: 3 -     Nitroglycerin; Place 1 tablet (0.4 mg  total) under the tongue every 5 (five) minutes as needed for chest pain.  Dispense: 50  tablet; Refill: 0 -     Troponin T -     EKG 12-Lead  Essential hypertension, benign Assessment & Plan: Hypertension managed with amlodipine. - Refill amlodipine prescription.  Orders: -     CBC with Differential/Platelet -     Comprehensive metabolic panel  Mixed hyperlipidemia Assessment & Plan: Concern about weight gain and interest in weight loss medication. Previous prescription not filled due to cost. - Discuss weight loss medication options and cost assistance. - Encourage continued physical activity and walking.   Orders: -     Lipid panel  Palpitations Assessment & Plan: Intermittent palpitations and chest tightness, possibly anxiety-induced or cardiac-related. EKG shows minor changes, not immediately concerning. - Order stat labs: troponin, CBC, kidney and liver function, thyroid function tests. - Prescribe metoprolol, half tablet twice daily. - Prescribe nitroglycerin for chest pain >10 minutes. - Prescribe baby aspirin. - Instruct urgent care if chest tightness >30 minutes. - Instruct ER if chest pain >10 minutes. - Advise minimizing stressors. - Recommend cardiology follow-up if labs normal and symptoms persist.  Orders: -     Hemoglobin A1c -     T4, free -     TSH         Meds ordered this encounter  Medications   amLODipine (NORVASC) 5 MG tablet    Sig: Take 1 tablet (5 mg total) by mouth daily.    Dispense:  90 tablet    Refill:  0   metoprolol tartrate (LOPRESSOR) 25 MG tablet    Sig: Take 0.5 tablets (12.5 mg total) by mouth 2 (two) times daily.    Dispense:  180 tablet    Refill:  3   nitroGLYCERIN (NITROSTAT) 0.4 MG SL tablet    Sig: Place 1 tablet (0.4 mg total) under the tongue every 5 (five) minutes as needed for chest pain.    Dispense:  50 tablet    Refill:  0    Orders Placed This Encounter  Procedures   Troponin T, STAT ([facility])   CBC with Differential/Platelet   Comprehensive metabolic panel   Hemoglobin A1c   Lipid panel    T4, free   TSH   EKG 12-Lead     Follow-up: No follow-ups on file.  An After Visit Summary was printed and given to the patient.  Langley Gauss, [facility] [phone]

## 2024-01-15 LAB — CBC WITH DIFFERENTIAL/PLATELET
Basophils Absolute: 0 10*3/uL (ref 0.0–0.2)
Basos: 1 %
EOS (ABSOLUTE): 0.1 10*3/uL (ref 0.0–0.4)
Eos: 3 %
Hematocrit: 40.1 % (ref 34.0–46.6)
Hemoglobin: 13 g/dL (ref 11.1–15.9)
Immature Grans (Abs): 0 10*3/uL (ref 0.0–0.1)
Immature Granulocytes: 0 %
Lymphocytes Absolute: 1.8 10*3/uL (ref 0.7–3.1)
Lymphs: 39 %
MCH: 30.7 pg (ref 26.6–33.0)
MCHC: 32.4 g/dL (ref 31.5–35.7)
MCV: 95 fL (ref 79–97)
Monocytes Absolute: 0.4 10*3/uL (ref 0.1–0.9)
Monocytes: 9 %
Neutrophils Absolute: 2.3 10*3/uL (ref 1.4–7.0)
Neutrophils: 48 %
Platelets: 155 10*3/uL (ref 150–450)
RBC: 4.23 x10E6/uL (ref 3.77–5.28)
RDW: 12.1 % (ref 11.7–15.4)
WBC: 4.8 10*3/uL (ref 3.4–10.8)

## 2024-01-15 LAB — COMPREHENSIVE METABOLIC PANEL
ALT: 28 IU/L (ref 0–32)
AST: 25 IU/L (ref 0–40)
Albumin: 4.8 g/dL (ref 3.9–4.9)
Alkaline Phosphatase: 94 IU/L (ref 44–121)
BUN/Creatinine Ratio: 18 (ref 12–28)
BUN: 17 mg/dL (ref 8–27)
Bilirubin Total: 0.5 mg/dL (ref 0.0–1.2)
CO2: 25 mmol/L (ref 20–29)
Calcium: 9.8 mg/dL (ref 8.7–10.3)
Chloride: 102 mmol/L (ref 96–106)
Creatinine, Ser: 0.95 mg/dL (ref 0.57–1.00)
Globulin, Total: 2.8 g/dL (ref 1.5–4.5)
Glucose: 81 mg/dL (ref 70–99)
Potassium: 3.9 mmol/L (ref 3.5–5.2)
Sodium: 142 mmol/L (ref 134–144)
Total Protein: 7.6 g/dL (ref 6.0–8.5)
eGFR: 67 mL/min/{1.73_m2} (ref >59)

## 2024-01-15 LAB — TROPONIN T: Troponin T (Highly Sensitive): <6 ng/L (ref 0–14)

## 2024-01-15 LAB — LIPID PANEL
Chol/HDL Ratio: 2.9 ratio (ref 0.0–4.4)
Cholesterol, Total: 153 mg/dL (ref 100–199)
HDL: 52 mg/dL (ref >39)
LDL Chol Calc (NIH): 87 mg/dL (ref 0–99)
Triglycerides: 70 mg/dL (ref 0–149)
VLDL Cholesterol Cal: 14 mg/dL (ref 5–40)

## 2024-01-15 LAB — T4, FREE: Free T4: 1.04 ng/dL (ref 0.82–1.77)

## 2024-01-15 LAB — HEMOGLOBIN A1C

## 2024-01-15 LAB — TSH: TSH: 2.48 u[IU]/mL (ref 0.450–4.500)

## 2024-01-17 ENCOUNTER — Encounter: Payer: Self-pay | Admitting: Physician Assistant

## 2024-01-17 DIAGNOSIS — R0789 Other chest pain: Secondary | ICD-10-CM | POA: Insufficient documentation

## 2024-01-17 HISTORY — DX: Other chest pain: R07.89

## 2024-01-17 NOTE — Assessment & Plan Note (Signed)
 Concern about weight gain and interest in weight loss medication. Previous prescription not filled due to cost. - Discuss weight loss medication options and cost assistance. - Encourage continued physical activity and walking.

## 2024-01-17 NOTE — Assessment & Plan Note (Signed)
 Intermittent palpitations and chest tightness, possibly anxiety-induced or cardiac-related. EKG shows minor changes, not immediately concerning. - Order stat labs: troponin, CBC, kidney and liver function, thyroid function tests. - Prescribe metoprolol, half tablet twice daily. - Prescribe nitroglycerin for chest pain >10 minutes. - Prescribe baby aspirin. - Instruct urgent care if chest tightness >30 minutes. - Instruct ER if chest pain >10 minutes. - Advise minimizing stressors. - Recommend cardiology follow-up if labs normal and symptoms persist.

## 2024-01-17 NOTE — Assessment & Plan Note (Signed)
 Hypertension managed with amlodipine. - Refill amlodipine prescription.

## 2024-01-25 ENCOUNTER — Other Ambulatory Visit: Payer: Self-pay | Admitting: Family Medicine

## 2024-01-25 DIAGNOSIS — R0789 Other chest pain: Secondary | ICD-10-CM

## 2024-01-27 ENCOUNTER — Other Ambulatory Visit: Payer: Self-pay

## 2024-02-02 ENCOUNTER — Ambulatory Visit: Attending: Cardiology | Admitting: Cardiology

## 2024-02-02 ENCOUNTER — Encounter: Payer: Self-pay | Admitting: Cardiology

## 2024-02-02 VITALS — BP 136/84 | HR 71 | Ht 66.6 in | Wt 205.0 lb

## 2024-02-02 DIAGNOSIS — E782 Mixed hyperlipidemia: Secondary | ICD-10-CM

## 2024-02-02 DIAGNOSIS — I10 Essential (primary) hypertension: Secondary | ICD-10-CM | POA: Diagnosis not present

## 2024-02-02 DIAGNOSIS — E66811 Obesity, class 1: Secondary | ICD-10-CM | POA: Diagnosis not present

## 2024-02-02 DIAGNOSIS — R002 Palpitations: Secondary | ICD-10-CM | POA: Diagnosis not present

## 2024-02-02 NOTE — Patient Instructions (Signed)
 FDA-cleared personal EKG: The world's most clinically validated personal EKG, FDA-cleared to detect Atrial Fibrillation, Bradycardia, and Tachycardia. Kathy Villa is the most reliable way to check in on your heart from home. Take your EKG from anywhere: Capture a medical-grade EKG in 30 seconds and get an instant analysis right on your smartphone. Kathy Villa is small enough to fit in your pocket, so you can take it with you anywhere. Easy to use: Simply place your fingers on the sensors--no wires, patches, or gels. Recommended by doctors: A trusted resource, Kathy Villa is the #1 doctor-recommended personal EKG with more than 100 million EKGs recorded. Save or share your EKGs: With the press of a button, email your EKGs to your doctor or save them on your phone. Works with smartphones: Compatible with Event organiser and tablets. Check our compatibility chart. FSA/HSA eligible: Purchase using an FSA or HSA account (please confirm coverage with your insurance provider). Phone clip included with purchase, a $15 value. Conveniently take your device with you wherever you go.  [email or web address]   Step One- Record your EKG strip on [facility] app.   Step two- On Kardia EKG click "Download"   Step three- It will prompt you to make a password for this EKG. Please make the password "Villa" so that we can view it.   Step four- Click on the little "upload" button (small box with an arrow in the middle) in the bottom left-hand corner of the screen.   Step five- Click "Save to Files"  Step six- Click on "On my iphone" and then "Pages" then press save in the top right-hand corner.   NOW GO TO MYCHART   Once on MyChart click "Messages"  Step one- Click "Send a message"  Step two- Click "Ask a medical question"   Step three- Click "Non urgent medical question"   Step four- Click on Kathy Villa's name.  Step five- Click on the small  paperclip at the bottom of the screen  Step six- Click "Choose file"  Step seven- Pick the most recent EKG strip listed.   Once uploaded send the message!  Medication Instructions:  Your physician recommends that you continue on your current medications as directed. Please refer to the Current Medication list given to you today.  *If you need a refill on your cardiac medications before your next appointment, please call your pharmacy*   Lab Work: None ordered If you have labs (blood work) drawn today and your tests are completely normal, you will receive your results only by: MyChart Message (if you have MyChart) OR A paper copy in the mail If you have any lab test that is abnormal or we need to change your treatment, we will call you to review the results.   Testing/Procedures: We will order CT coronary calcium score. It will cost $99.00 and is due at time of scan.  Please call to schedule.    [facility] [address] [place], [place] [address] [phone]  Follow-Up: At [facility], you and your health needs are our priority.  As part of our continuing mission to provide you with exceptional heart care, we have created designated Provider Care Teams.  These Care Teams include your primary Cardiologist (physician) and Advanced Practice Providers (APPs -  Physician Assistants and Nurse Practitioners) who all work together to provide you with the care you need, when you need it.  We recommend signing up for the patient portal called "MyChart".  Sign  up information is provided on this After Visit Summary.  MyChart is used to connect with patients for Virtual Visits (Telemedicine).  Patients are able to view lab/test results, encounter notes, upcoming appointments, etc.  Non-urgent messages can be sent to your provider as well.   To learn more about what you can do with MyChart, go to [email or web address].    Your next  appointment:   9 month(s)  The format for your next appointment:   In Person  Provider:   Belva Crome, MD    Other Instructions Coronary Calcium Scan A coronary calcium scan is an imaging test used to look for deposits of plaque in the inner lining of the blood vessels of the heart (coronary arteries). Plaque is made up of calcium, protein, and fatty substances. These deposits of plaque can partly clog and narrow the coronary arteries without producing any symptoms or warning signs. This puts a person at risk for a heart attack. A coronary calcium scan is performed using a computed tomography (CT) scanner machine without using a dye (contrast). This test is recommended for people who are at moderate risk for heart disease. The test can find plaque deposits before symptoms develop. Tell a health care provider about: Any allergies you have. All medicines you are taking, including vitamins, herbs, eye drops, creams, and over-the-counter medicines. Any problems you or family members have had with anesthetic medicines. Any bleeding problems you have. Any surgeries you have had. Any medical conditions you have. Whether you are pregnant or may be pregnant. What are the risks? Generally, this is a safe procedure. However, problems may occur, including: Harm to a pregnant woman and her unborn baby. This test involves the use of radiation. Radiation exposure can be dangerous to a pregnant woman and her unborn baby. If you are pregnant or think you may be pregnant, you should not have this procedure done. A slight increase in the risk of cancer. This is because of the radiation involved in the test. The amount of radiation from one test is similar to the amount of radiation you are naturally exposed to over one year. What happens before the procedure? Ask your health care provider for any specific instructions on how to prepare for this procedure. You may be asked to avoid products that contain  caffeine, tobacco, or nicotine for 4 hours before the procedure. What happens during the procedure?  You will undress and remove any jewelry from your neck or chest. You may need to remove hearing aides and dentures. Women may need to remove their bras. You will put on a hospital gown. Sticky electrodes will be placed on your chest. The electrodes will be connected to an electrocardiogram (ECG) machine to record a tracing of the electrical activity of your heart. You will lie down on your back on a curved bed that is attached to the CT scanner. You may be given medicine to slow down your heart rate so that clear pictures can be created. You will be moved into the CT scanner, and the CT scanner will take pictures of your heart. During this time, you will be asked to lie still and hold your breath for 10-20 seconds at a time while each picture of your heart is being taken. The procedure may vary among health care providers and hospitals. What can I expect after the procedure? You can return to your normal activities. It is up to you to get the results of your procedure. Ask your health care provider, or  the department that is doing the procedure, when your results will be ready. Summary A coronary calcium scan is an imaging test used to look for deposits of plaque in the inner lining of the blood vessels of the heart. Plaque is made up of calcium, protein, and fatty substances. A coronary calcium scan is performed using a CT scanner machine without contrast. Generally, this is a safe procedure. Tell your health care provider if you are pregnant or may be pregnant. Ask your health care provider for any specific instructions on how to prepare for this procedure. You can return to your normal activities after the scan is done. This information is not intended to replace advice given to you by your health care provider. Make sure you discuss any questions you have with your health care  provider. Document Revised: 10/05/2021 Document Reviewed: 10/05/2021 Elsevier Patient Education  2024 Elsevier Inc.  Important Information About Sugar

## 2024-02-02 NOTE — Progress Notes (Signed)
 Cardiology Office Note:    Date:  02/02/2024   ID:  Kathy Villa, DOB May 08, 1961, MRN [id number]  PCP:  Blane Ohara, MD  Cardiologist:  Garwin Brothers, MD   Referring MD: Blane Ohara, MD    ASSESSMENT:    1. Mixed hyperlipidemia   2. Palpitations   3. Essential hypertension, benign   4. Obesity (BMI 30.0-34.9)    PLAN:    In order of problems listed above:  Primary prevention stressed with the patient.  Importance of compliance with diet medication stressed and patient verbalized standing. She was advised to walk at least half an hour a day on a daily basis and she promises to do this. Palpitations: These happened several weeks ago.  She is not keen on again 2 weeks ZIO monitoring.  I suggested Lourena Simmonds and she is agreeable.  She will self monitor and let us know if there is any issues. For risk stratification from a coronary perspective I recommended calcium scoring and she is agreeable.   Essential hypertension: Blood pressure stable and diet was emphasized.  Lifestyle modification encouraged.  Salt in diet and exercise were discussed.  Lifestyle modification encouraged. Mixed dyslipidemia: On lipid-lowering medications followed by primary care.  Lipids discussed. Obesity: Weight reduction stressed to stop obesity explained and she promises to do better. Patient will be seen in follow-up appointment in 6 months or earlier if the patient has any concerns.    Medication Adjustments/Labs and Tests Ordered: Current medicines are reviewed at length with the patient today.  Concerns regarding medicines are outlined above.  Orders Placed This Encounter  Procedures   EKG 12-Lead   No orders of the defined types were placed in this encounter.    No chief complaint on file.    History of Present Illness:    Kathy Villa is a 63 y.o. female.  Patient has past medical history of essential hypertension, dyslipidemia and obesity.  She tells me that she walks on a regular  basis about 30 minutes without any problems.  She has occasional palpitations.  No dizziness syncope orthopnea or PND.  She is here for follow-up.  She is overweight.  At the time of my evaluation, the patient is alert awake oriented and in no distress.  Past Medical History:  Diagnosis Date   Acquired deformity of toenail 11/24/2023   Acute cough 11/07/2020   Acute cystitis without hematuria 05/29/2023   Attention deficit hyperactivity disorder, predominantly inattentive type    prefers no medicines.   Cardiac murmur 02/07/2021   Chest tightness 01/17/2024   Chronic pain of right knee 10/20/2023   Class 1 obesity due to excess calories with serious comorbidity and body mass index (BMI) of 31.0 to 31.9 in adult 02/13/2023   Dizziness and giddiness 01/14/2020   DOE (dyspnea on exertion) 02/07/2021   Dysuria 05/29/2023   Encounter for immunization 08/28/2020   Essential (primary) hypertension 2016   Essential hypertension, benign 08/28/2020   Fatigue 11/07/2020   GERD (gastroesophageal reflux disease) 09/02/2022   Ingrown toenail of both feet 10/20/2023   Insomnia 01/14/2020   Left-sided headache 01/14/2020   Lumbar pain 11/04/2022   Migraine with aura, intractable, without status migrainosus 1980   Mixed hyperlipidemia 08/28/2020   Need for influenza vaccination 09/03/2022   Obesity (BMI 30.0-34.9) 02/07/2021   Onychomycosis 11/23/2021   Other specified nontoxic goiter    Overweight    Palpitations 02/07/2021   Paresthesias 01/14/2020   Recurrent UTI 09/03/2022   Sciatica of  left side associated with disorder of lumbar spine 09/03/2022   Seasonal allergies 03/12/2023   Skin tag 02/13/2023   SLE (systemic lupus erythematosus related syndrome) (HCC) 01/14/2020   Systemic lupus erythematosus, organ or system involvement unspecified (HCC) 2006   Visit for screening mammogram 09/02/2022    Past Surgical History:  Procedure Laterality Date   CESAREAN SECTION      Current  Medications: Current Meds  Medication Sig   albuterol (VENTOLIN HFA) 108 (90 Base) MCG/ACT inhaler Inhale 2 puffs into the lungs every 6 (six) hours as needed for wheezing or shortness of breath.   amLODipine (NORVASC) 5 MG tablet TAKE 1 TABLET(5 MG) BY MOUTH DAILY   hydroxychloroquine (PLAQUENIL) 200 MG tablet TAKE 1 TABLET(200 MG) BY MOUTH TWICE DAILY   nitroGLYCERIN (NITROSTAT) 0.4 MG SL tablet Place 1 tablet (0.4 mg total) under the tongue every 5 (five) minutes as needed for chest pain.   omeprazole (PRILOSEC) 40 MG capsule Take 1 capsule (40 mg total) by mouth daily.     Allergies:   Patient has no known allergies.   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  Tobacco Use   Smoking status: Former    Current packs/day: 0.00    Types: Cigarettes    Quit date: 11/09/1982    Years since quitting: 41.2   Smokeless tobacco: Never  Vaping Use   Vaping status: Never Used  Substance and Sexual Activity   Alcohol use: Not Currently   Drug use: Never   Sexual activity: Yes  Other Topics Concern   Not on file  Social History Narrative   Right handed   Lives with husband   Social Drivers of Health   Financial Resource Strain: Low Risk  (10/09/2022)   Overall Financial Resource Strain (CARDIA)    Difficulty of Paying Living Expenses: Not hard at all  Food Insecurity: No Food Insecurity (10/09/2022)   Hunger Vital Sign    Worried About Running Out of Food in the Last Year: Never true    Ran Out of Food in the Last Year: Never true  Transportation Needs: No Transportation Needs (10/09/2022)   PRAPARE - Administrator, Civil Service (Medical): No    Lack of Transportation (Non-Medical): No  Physical Activity: Insufficiently Active  (10/09/2022)   Exercise Vital Sign    Days of Exercise per Week: 3 days    Minutes of Exercise per Session: 20 min  Stress: No Stress Concern Present (10/09/2022)   [employer] of Occupational Health - Occupational Stress Questionnaire    Feeling of Stress : Not at all  Social Connections: Moderately Integrated (10/09/2022)   Social Connection and Isolation Panel [NHANES]    Frequency of Communication with Friends and Family: More than three times a week    Frequency of Social Gatherings with Friends and Family: More than three times a week    Attends Religious Services: More than 4 times per year    Active Member of [employer] or Organizations: No    Attends Banker Meetings: Never    Marital Status: Married     Family History: The patient's family history includes Congestive Heart Failure in her father.  ROS:   Please see  the history of present illness.    All other systems reviewed and are negative.  EKGs/Labs/Other Studies Reviewed:    The following studies were reviewed today: I discussed findings of the echo and stress test with the patient at length   Recent Labs: 01/14/2024: ALT 28; BUN 17; Creatinine, Ser 0.95; Hemoglobin 13.0; Platelets 155; Potassium 3.9; Sodium 142; TSH 2.480  Recent Lipid Panel    Component Value Date/Time   CHOL 153 01/14/2024 0918   TRIG 70 01/14/2024 0918   HDL 52 01/14/2024 0918   CHOLHDL 2.9 01/14/2024 0918   LDLCALC 87 01/14/2024 0918    Physical Exam:    VS:  BP 136/84   Pulse 71   Ht 5' 6.6" (1.692 m)   Wt 205 lb (93 kg)   SpO2 98%   BMI 32.49 kg/m     Wt Readings from Last 3 Encounters:  02/02/24 205 lb (93 kg)  01/14/24 204 lb 3.2 oz (92.6 kg)  11/24/23 203 lb (92.1 kg)     GEN: Patient is in no acute distress HEENT: Normal NECK: No JVD; No carotid bruits LYMPHATICS: No lymphadenopathy CARDIAC: Hear sounds regular, 2/6 systolic murmur at the apex. RESPIRATORY:  Clear to auscultation without rales, wheezing  or rhonchi  ABDOMEN: Soft, non-tender, non-distended MUSCULOSKELETAL:  No edema; No deformity  SKIN: Warm and dry NEUROLOGIC:  Alert and oriented x 3 PSYCHIATRIC:  Normal affect   Signed, Garwin Brothers, MD  02/02/2024 3:20 PM     [facility]

## 2024-02-03 ENCOUNTER — Other Ambulatory Visit (HOSPITAL_BASED_OUTPATIENT_CLINIC_OR_DEPARTMENT_OTHER): Payer: Self-pay | Admitting: Family Medicine

## 2024-02-03 DIAGNOSIS — Z1231 Encounter for screening mammogram for malignant neoplasm of breast: Secondary | ICD-10-CM

## 2024-02-08 ENCOUNTER — Other Ambulatory Visit: Payer: Self-pay | Admitting: Physician Assistant

## 2024-02-08 DIAGNOSIS — R0789 Other chest pain: Secondary | ICD-10-CM

## 2024-02-09 ENCOUNTER — Other Ambulatory Visit: Payer: Self-pay | Admitting: Family Medicine

## 2024-02-09 DIAGNOSIS — M329 Systemic lupus erythematosus, unspecified: Secondary | ICD-10-CM

## 2024-02-10 ENCOUNTER — Other Ambulatory Visit (HOSPITAL_BASED_OUTPATIENT_CLINIC_OR_DEPARTMENT_OTHER): Payer: Self-pay

## 2024-02-10 ENCOUNTER — Encounter (HOSPITAL_BASED_OUTPATIENT_CLINIC_OR_DEPARTMENT_OTHER): Payer: Self-pay

## 2024-02-10 ENCOUNTER — Ambulatory Visit (HOSPITAL_BASED_OUTPATIENT_CLINIC_OR_DEPARTMENT_OTHER): Admitting: Radiology

## 2024-02-10 ENCOUNTER — Ambulatory Visit (INDEPENDENT_AMBULATORY_CARE_PROVIDER_SITE_OTHER)
Admission: RE | Admit: 2024-02-10 | Discharge: 2024-02-10 | Disposition: A | Discharge status: Home / Self Care | Source: Ambulatory Visit | Attending: Family Medicine | Admitting: Family Medicine

## 2024-02-10 ENCOUNTER — Encounter (HOSPITAL_BASED_OUTPATIENT_CLINIC_OR_DEPARTMENT_OTHER): Payer: Self-pay | Admitting: Radiology

## 2024-02-10 DIAGNOSIS — Z1231 Encounter for screening mammogram for malignant neoplasm of breast: Secondary | ICD-10-CM | POA: Diagnosis not present

## 2024-02-10 MED ORDER — HYDROXYCHLOROQUINE SULFATE 200 MG PO TABS: ORAL_TABLET | ORAL | 0 refills | Status: DC

## 2024-02-11 ENCOUNTER — Other Ambulatory Visit (HOSPITAL_BASED_OUTPATIENT_CLINIC_OR_DEPARTMENT_OTHER): Admitting: Radiology

## 2024-02-15 ENCOUNTER — Encounter: Payer: Self-pay | Admitting: Family Medicine

## 2024-03-20 ENCOUNTER — Other Ambulatory Visit: Payer: Self-pay | Admitting: Family Medicine

## 2024-03-20 ENCOUNTER — Encounter: Payer: Self-pay | Admitting: Family Medicine

## 2024-03-20 MED ORDER — PHENTERMINE HCL 37.5 MG PO TABS: 37.5000 mg | ORAL_TABLET | Freq: Every day | ORAL | 0 refills | Status: DC

## 2024-04-04 ENCOUNTER — Encounter: Payer: Self-pay | Admitting: Family Medicine

## 2024-04-17 ENCOUNTER — Other Ambulatory Visit: Payer: Self-pay | Admitting: Family Medicine

## 2024-04-17 DIAGNOSIS — R0789 Other chest pain: Secondary | ICD-10-CM

## 2024-04-17 MED ORDER — AMLODIPINE BESYLATE 5 MG PO TABS: 5.0000 mg | ORAL_TABLET | Freq: Every day | ORAL | 0 refills | Status: AC

## 2024-04-17 NOTE — Telephone Encounter (Signed)
 Copied from CRM [id number]. Topic: Clinical - Medication Refill >> Apr 17, 2024  3:23 PM Sophia H wrote: Medication: amLODipine  (NORVASC ) 5 MG tablet  Has the patient contacted their pharmacy? Yes, pharmacy told patient to contact office. No fills on file   This is the patient's preferred pharmacy:  [facility] [id number] - [place], [place] - [address] AT [facility] [address] [facility] [address] & [address] [place] [address] Phone: [phone] Fax: [phone]   Is this the correct pharmacy for this prescription? Yes If no, delete pharmacy and type the correct one.   Has the prescription been filled recently? Yes, 01/25/24  Is the patient out of the medication? Yes  Has the patient been seen for an appointment in the last year OR does the patient have an upcoming appointment? Yes  Can we respond through MyChart? Yes  Agent: Please be advised that Rx refills may take up to 3 business days. We ask that you follow-up with your pharmacy.

## 2024-05-08 ENCOUNTER — Encounter: Payer: Self-pay | Admitting: Family Medicine

## 2024-05-11 ENCOUNTER — Ambulatory Visit

## 2024-05-15 ENCOUNTER — Other Ambulatory Visit: Payer: Self-pay

## 2024-05-15 DIAGNOSIS — M329 Systemic lupus erythematosus, unspecified: Secondary | ICD-10-CM

## 2024-05-15 MED ORDER — HYDROXYCHLOROQUINE SULFATE 200 MG PO TABS: ORAL_TABLET | ORAL | 2 refills | Status: DC

## 2024-05-18 ENCOUNTER — Ambulatory Visit (INDEPENDENT_AMBULATORY_CARE_PROVIDER_SITE_OTHER)

## 2024-05-18 DIAGNOSIS — Z23 Encounter for immunization: Secondary | ICD-10-CM

## 2024-05-18 NOTE — Progress Notes (Signed)
 Patient is in office today for a nurse visit for Immunization. Patient Injection was given in the  Right deltoid. Patient tolerated injection well.

## 2024-05-26 ENCOUNTER — Encounter: Payer: Self-pay | Admitting: Family Medicine

## 2024-05-31 ENCOUNTER — Encounter: Payer: Self-pay | Admitting: Cardiology

## 2024-05-31 ENCOUNTER — Ambulatory Visit: Attending: Cardiology | Admitting: Cardiology

## 2024-05-31 ENCOUNTER — Ambulatory Visit

## 2024-05-31 VITALS — BP 134/76 | HR 70 | Ht 66.6 in | Wt 204.4 lb

## 2024-05-31 DIAGNOSIS — R002 Palpitations: Secondary | ICD-10-CM

## 2024-05-31 DIAGNOSIS — I10 Essential (primary) hypertension: Secondary | ICD-10-CM

## 2024-05-31 DIAGNOSIS — E782 Mixed hyperlipidemia: Secondary | ICD-10-CM | POA: Diagnosis not present

## 2024-05-31 DIAGNOSIS — E66811 Obesity, class 1: Secondary | ICD-10-CM

## 2024-05-31 NOTE — Patient Instructions (Signed)
 Medication Instructions:  Continue same medications *If you need a refill on your cardiac medications before your next appointment, please call your pharmacy*  Lab Work: None ordered  Testing/Procedures: 2 week Heart Monitor ( Zio )   CT Calcium  Score  Follow-Up: At [facility], you and your health needs are our priority.  As part of our continuing mission to provide you with exceptional heart care, our providers are all part of one team.  This team includes your primary Cardiologist (physician) and Advanced Practice Providers or APPs (Physician Assistants and Nurse Practitioners) who all work together to provide you with the care you need, when you need it.  Your next appointment:  12 months    Provider:  Dr.Revankar   We recommend signing up for the patient portal called MyChart.  Sign up information is provided on this After Visit Summary.  MyChart is used to connect with patients for Virtual Visits (Telemedicine).  Patients are able to view lab/test results, encounter notes, upcoming appointments, etc.  Non-urgent messages can be sent to your provider as well.   To learn more about what you can do with MyChart, go to [email or web address].

## 2024-05-31 NOTE — Progress Notes (Signed)
 Cardiology Office Note:    Date:  05/31/2024   ID:  Kathy Villa, DOB [birth year], MRN [id number]  PCP:  Sherre Clapper, MD  Cardiologist:  Jennifer JONELLE Crape, MD   Referring MD: Sherre Clapper, MD    ASSESSMENT:    1. Mixed hyperlipidemia   2. Essential (primary) hypertension   3. Obesity (BMI 30.0-34.9)   4. Palpitations    PLAN:    In order of problems listed above:  Primary prevention stressed with the patient.  Importance of compliance with diet medication stressed and patient verbalized standing. Palpitations: Will do a 2-week monitor to assess this.  Overall symptoms appear benign. Mixed dyslipidemia: I want to do focused intervention and therefore discussed calcium  score.  She is agreeable.  For now she is not on statin and will wait to hear from us  about calcium  scores. Obesity: Weight reduction stressed diet emphasized and she promises to do better. Essential hypertension: Blood pressure stable and diet was emphasized.  If palpitations recur or any significant findings on the monitor we will switch to beta-blocker. Patient will be seen in follow-up appointment in 6 months or earlier if the patient has any concerns.    Medication Adjustments/Labs and Tests Ordered: Current medicines are reviewed at length with the patient today.  Concerns regarding medicines are outlined above.  Orders Placed This Encounter  Procedures   CT CARDIAC SCORING (SELF PAY ONLY)   LONG TERM MONITOR (3-14 DAYS)   EKG 12-Lead   No orders of the defined types were placed in this encounter.    No chief complaint on file.    History of Present Illness:    Kathy Villa is a 63 y.o. female.  Patient has past medical history of palpitations, mixed dyslipidemia and essential hypertension.  Overall she leads a sedentary lifestyle.  She denies any chest pain orthopnea or PND.  At the time of my evaluation, the patient is alert awake oriented and in no distress.  She gives history of  palpitations at times.  No dizziness or syncope.  She is concerned and wants an evaluation.  Past Medical History:  Diagnosis Date   Acquired deformity of toenail 11/24/2023   Acute cough 11/07/2020   Acute cystitis without hematuria 05/29/2023   Attention deficit hyperactivity disorder, predominantly inattentive type    prefers no medicines.   Cardiac murmur 02/07/2021   Chest tightness 01/17/2024   Chronic pain of right knee 10/20/2023   Class 1 obesity due to excess calories with serious comorbidity and body mass index (BMI) of 31.0 to 31.9 in adult 02/13/2023   DOE (dyspnea on exertion) 02/07/2021   Dysuria 05/29/2023   Encounter for immunization 08/28/2020   Essential (primary) hypertension 2016   Essential hypertension, benign 08/28/2020   GERD (gastroesophageal reflux disease) 09/02/2022   Ingrown toenail of both feet 10/20/2023   Insomnia 01/14/2020   Lumbar pain 11/04/2022   Migraine with aura, intractable, without status migrainosus 1980   Mixed hyperlipidemia 08/28/2020   Need for influenza vaccination 09/03/2022   Obesity (BMI 30.0-34.9) 02/07/2021   Onychomycosis 11/23/2021   Other specified nontoxic goiter    Overweight    Palpitations 02/07/2021   Paresthesias 01/14/2020   Recurrent UTI 09/03/2022   Sciatica of left side associated with disorder of lumbar spine 09/03/2022   Seasonal allergies 03/12/2023   Skin tag 02/13/2023   SLE (systemic lupus erythematosus related syndrome) (HCC) 01/14/2020   Systemic lupus erythematosus, organ or system involvement unspecified (HCC) 2006   Visit  for screening mammogram 09/02/2022    Past Surgical History:  Procedure Laterality Date   CESAREAN SECTION      Current Medications: Current Meds  Medication Sig   amLODipine  (NORVASC ) 5 MG tablet Take 1 tablet (5 mg total) by mouth daily.   azelastine  (ASTELIN ) 0.1 % nasal spray Place 2 sprays into both nostrils 2 (two) times daily. Use in each nostril as directed (Patient  taking differently: Place 2 sprays into both nostrils as needed for rhinitis or allergies. Use in each nostril as directed)   hydroxychloroquine  (PLAQUENIL ) 200 MG tablet TAKE 1 TABLET(200 MG) BY MOUTH TWICE DAILY   metoprolol  tartrate (LOPRESSOR ) 25 MG tablet Take 0.5 tablets (12.5 mg total) by mouth 2 (two) times daily. (Patient taking differently: Take 12.5 mg by mouth as needed (palpitations).)   nitroGLYCERIN  (NITROSTAT ) 0.4 MG SL tablet Place 1 tablet (0.4 mg total) under the tongue every 5 (five) minutes as needed for chest pain.   omeprazole  (PRILOSEC) 40 MG capsule Take 1 capsule (40 mg total) by mouth daily.     Allergies:   Patient has no known allergies.   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[place] [employer]  Tobacco Use   Smoking status: Former    Current packs/day: 0.00    Types: Cigarettes    Quit date: 11/09/1982    Years since quitting: 41.5   Smokeless tobacco: Never  Vaping Use   Vaping status: Never Used  Substance and Sexual Activity   Alcohol use: Not Currently   Drug use: Never   Sexual activity: Yes  Other Topics Concern   Not on file  Social History Narrative   Right handed   Lives with husband   Social Drivers of Health   Financial Resource Strain: Low Risk  (10/09/2022)   Overall Financial Resource Strain (CARDIA)    Difficulty of Paying Living Expenses: Not hard at all  Food Insecurity: No Food Insecurity (10/09/2022)   Hunger Vital Sign    Worried About Running Out of Food in the Last Year: Never true    Ran Out of Food in the Last Year: Never true  Transportation Needs: No Transportation Needs (10/09/2022)   PRAPARE - Administrator, Civil Service (Medical): No    Lack of Transportation (Non-Medical): No   Physical Activity: Insufficiently Active (10/09/2022)   Exercise Vital Sign    Days of Exercise per Week: 3 days    Minutes of Exercise per Session: 20 min  Stress: No Stress Concern Present (10/09/2022)   [employer] of Occupational Health - Occupational Stress Questionnaire    Feeling of Stress : Not at all  Social Connections: Moderately Integrated (10/09/2022)   Social Connection and Isolation Panel    Frequency of Communication with Friends and Family: More than three times a week    Frequency of Social Gatherings with Friends and Family: More than three times a week    Attends Religious Services: More than 4 times per year    Active Member of [employer] or Organizations: No    Attends Banker Meetings: Never    Marital Status: Married     Family History: The patient's family history includes Congestive Heart Failure in her father.  ROS:   Please see the history of present illness.    All other systems reviewed and are negative.  EKGs/Labs/Other Studies Reviewed:    The following studies were reviewed today: .SABRAEKG Interpretation Date/Time:  Wednesday May 31 2024 13:47:10 EDT Ventricular Rate:  70 PR Interval:  208 QRS Duration:  88 QT Interval:  422 QTC Calculation: 455 R Axis:   29  Text Interpretation: Normal sinus rhythm Possible Left atrial enlargement Borderline ECG When compared with ECG of 02-Feb-2024 14:56, No significant change was found Confirmed by Edwyna Backers [phone] on 05/31/2024 2:09:25 PM     Recent Labs: 01/14/2024: ALT 28; BUN 17; Creatinine, Ser 0.95; Hemoglobin 13.0; Platelets 155; Potassium 3.9; Sodium 142; TSH 2.480  Recent Lipid Panel    Component Value Date/Time   CHOL 153 01/14/2024 0918   TRIG 70 01/14/2024 0918   HDL 52 01/14/2024 0918   CHOLHDL 2.9 01/14/2024 0918   LDLCALC 87 01/14/2024 0918    Physical Exam:    VS:  BP 134/76   Pulse 70   Ht 5' 6.6 (1.692 m)   Wt 204 lb 6.4 oz (92.7 kg)   SpO2 99%   BMI 32.40  kg/m     Wt Readings from Last 3 Encounters:  05/31/24 204 lb 6.4 oz (92.7 kg)  02/02/24 205 lb (93 kg)  01/14/24 204 lb 3.2 oz (92.6 kg)     GEN: Patient is in no acute distress HEENT: Normal NECK: No JVD; No carotid bruits LYMPHATICS: No lymphadenopathy CARDIAC: Hear sounds regular, 2/6 systolic murmur at the apex. RESPIRATORY:  Clear to auscultation without rales, wheezing or rhonchi  ABDOMEN: Soft, non-tender, non-distended MUSCULOSKELETAL:  No edema; No deformity  SKIN: Warm and dry NEUROLOGIC:  Alert and oriented x 3 PSYCHIATRIC:  Normal affect   Signed, Backers JONELLE Edwyna, MD  05/31/2024 2:29 PM    [facility]

## 2024-06-06 ENCOUNTER — Ambulatory Visit (HOSPITAL_BASED_OUTPATIENT_CLINIC_OR_DEPARTMENT_OTHER)
Admission: RE | Admit: 2024-06-06 | Discharge: 2024-06-06 | Disposition: A | Discharge status: Home / Self Care | Payer: Self-pay | Source: Ambulatory Visit | Attending: Cardiology | Admitting: Cardiology

## 2024-06-06 DIAGNOSIS — E782 Mixed hyperlipidemia: Secondary | ICD-10-CM

## 2024-06-06 DIAGNOSIS — R002 Palpitations: Secondary | ICD-10-CM

## 2024-06-06 DIAGNOSIS — I10 Essential (primary) hypertension: Secondary | ICD-10-CM

## 2024-06-08 ENCOUNTER — Ambulatory Visit: Payer: Self-pay | Admitting: Cardiology

## 2024-07-07 DIAGNOSIS — R002 Palpitations: Secondary | ICD-10-CM

## 2024-08-04 ENCOUNTER — Other Ambulatory Visit: Payer: Self-pay | Admitting: Family Medicine

## 2024-08-04 DIAGNOSIS — S61101A Unspecified open wound of right thumb with damage to nail, initial encounter: Secondary | ICD-10-CM

## 2024-09-06 ENCOUNTER — Ambulatory Visit: Payer: Self-pay

## 2024-09-06 ENCOUNTER — Encounter: Payer: Self-pay | Admitting: Family Medicine

## 2024-09-06 ENCOUNTER — Ambulatory Visit (INDEPENDENT_AMBULATORY_CARE_PROVIDER_SITE_OTHER): Admitting: Family Medicine

## 2024-09-06 VITALS — BP 162/86 | HR 82 | Temp 98.4°F | Resp 18 | Ht 66.5 in | Wt 206.8 lb

## 2024-09-06 DIAGNOSIS — R051 Acute cough: Secondary | ICD-10-CM | POA: Diagnosis not present

## 2024-09-06 DIAGNOSIS — J069 Acute upper respiratory infection, unspecified: Secondary | ICD-10-CM | POA: Insufficient documentation

## 2024-09-06 DIAGNOSIS — R6889 Other general symptoms and signs: Secondary | ICD-10-CM | POA: Insufficient documentation

## 2024-09-06 DIAGNOSIS — I10 Essential (primary) hypertension: Secondary | ICD-10-CM

## 2024-09-06 LAB — POC INFLUENZA A&B (BINAX/QUICKVUE)
Influenza A, POC: NEGATIVE
Influenza B, POC: NEGATIVE

## 2024-09-06 LAB — POC COVID19 BINAXNOW: SARS Coronavirus 2 Ag: NEGATIVE

## 2024-09-06 MED ORDER — ALBUTEROL SULFATE HFA 108 (90 BASE) MCG/ACT IN AERS: 2.0000 | INHALATION_SPRAY | Freq: Four times a day (QID) | RESPIRATORY_TRACT | 0 refills | Status: AC | PRN

## 2024-09-06 MED ORDER — GUAIFENESIN-CODEINE 100-10 MG/5ML PO SOLN: 5.0000 mL | Freq: Three times a day (TID) | ORAL | 0 refills | Status: DC | PRN

## 2024-09-06 NOTE — Assessment & Plan Note (Addendum)
 Acute cough Acute cough with congestion and chest tightness for ten days, severe enough to induce vomiting. Symptoms do not suggest pneumonia or other serious conditions. - Prescribe codeine-containing cough syrup to aid sleep. - Prescribe albuterol  inhaler for symptomatic relief. - Consider antibiotics if symptoms persist beyond ten days, considering risks of GI issues and antibiotic resistance. Orders:   POC Influenza A&B (Binax test)   POC COVID-19   guaiFENesin-codeine 100-10 MG/5ML syrup; Take 5 mLs by mouth 3 (three) times daily as needed.

## 2024-09-06 NOTE — Assessment & Plan Note (Addendum)
 Flu and Covid- negative - symptoms last week were very similar to flu and covid - fever, sore throat and fatigue - She is feeling better but has a bad cough that keeps her up at night. - Continue to hydrate and get adequate sleep Orders:   POC Influenza A&B (Binax test)   POC COVID-19

## 2024-09-06 NOTE — Assessment & Plan Note (Addendum)
 Elevated blood pressure due to decongestant use Blood pressure elevated at 162/86, likely secondary to decongestant use. Previous blood pressure was 134/76 in July. Discussed impact of decongestants on blood pressure and importance of monitoring. - Advise discontinuation of decongestants. - Monitor blood pressure and report if it decreases. - Contact healthcare provider if blood pressure normalizes.

## 2024-09-06 NOTE — Progress Notes (Signed)
 Acute Office Visit  Subjective:    Patient ID: Kathy Villa, female    DOB: [birth year], 63 y.o.   MRN: [id number]  Chief Complaint  Patient presents with   Cough    Discussed the use of AI scribe software for clinical note transcription with the patient, who gave verbal consent to proceed.  History of Present Illness   Kathy Villa is a 63 year old female who presents with sinus congestion and cough.  Upper respiratory symptoms - Sinus congestion and pressure since last Friday, with pressure located on top and underneath the sinuses - Ongoing congestion and cough, particularly at night, disrupting her husband's sleep - Sore throat and ear pain last week, both have resolved - No current sore throat, headache, fever, or ear pain  Cough - Significant coughing, severe enough to induce vomiting on a couple of occasions - Coughing episodes occur once or twice a year - Uses prescription albuterol  inhaler during these episodes - No current chest pain - Chest tightness present  Medication sensitivities and adverse effects - Taking Robitussin-D, which she believes causes tachycardia and elevated blood pressure - Sensitive to medications; decongestants and Zyrtec cause significant drowsiness       Past Medical History:  Diagnosis Date   Acquired deformity of toenail 11/24/2023   Acute cough 11/07/2020   Acute cystitis without hematuria 05/29/2023   Attention deficit hyperactivity disorder, predominantly inattentive type    prefers no medicines.   Cardiac murmur 02/07/2021   Chest tightness 01/17/2024   Chronic pain of right knee 10/20/2023   Class 1 obesity due to excess calories with serious comorbidity and body mass index (BMI) of 31.0 to 31.9 in adult 02/13/2023   DOE (dyspnea on exertion) 02/07/2021   Dysuria 05/29/2023   Encounter for immunization 08/28/2020   Essential (primary) hypertension 2016   Essential hypertension, benign 08/28/2020   GERD  (gastroesophageal reflux disease) 09/02/2022   Ingrown toenail of both feet 10/20/2023   Insomnia 01/14/2020   Lumbar pain 11/04/2022   Migraine with aura, intractable, without status migrainosus 1980   Mixed hyperlipidemia 08/28/2020   Need for influenza vaccination 09/03/2022   Obesity (BMI 30.0-34.9) 02/07/2021   Onychomycosis 11/23/2021   Other specified nontoxic goiter    Overweight    Palpitations 02/07/2021   Paresthesias 01/14/2020   Recurrent UTI 09/03/2022   Sciatica of left side associated with disorder of lumbar spine 09/03/2022   Seasonal allergies 03/12/2023   Skin tag 02/13/2023   SLE (systemic lupus erythematosus related syndrome) (HCC) 01/14/2020   Systemic lupus erythematosus, organ or system involvement unspecified (HCC) 2006   Visit for screening mammogram 09/02/2022    Past Surgical History:  Procedure Laterality Date   CESAREAN SECTION      Family History  Problem Relation Age of Onset   Congestive Heart Failure Father     Social History   Socioeconomic History   Marital status: Married    Spouse name: Not on file   Number of children: 3   Years of education: Not on file   Highest education level: Not on file  Occupational History   Occupation: Production Designer, Theatre/television/film- [employer]  Tobacco Use   Smoking status: Former    Current packs/day: 0.00    Types: Cigarettes    Quit date: 11/09/1982    Years since quitting: 41.8   Smokeless tobacco: Never  Vaping Use   Vaping status: Never Used  Substance and Sexual Activity   Alcohol  use: Not Currently   Drug use: Never   Sexual activity: Yes  Other Topics Concern   Not on file  Social History Narrative   Right handed   Lives with husband   Social Drivers of Health   Financial Resource Strain: Low Risk  (10/09/2022)   Overall Financial Resource Strain (CARDIA)    Difficulty of Paying Living Expenses: Not hard at all  Food Insecurity: No Food Insecurity (10/09/2022)   Hunger  Vital Sign    Worried About Running Out of Food in the Last Year: Never true    Ran Out of Food in the Last Year: Never true  Transportation Needs: No Transportation Needs (10/09/2022)   PRAPARE - Administrator, Civil Service (Medical): No    Lack of Transportation (Non-Medical): No  Physical Activity: Insufficiently Active (10/09/2022)   Exercise Vital Sign    Days of Exercise per Week: 3 days    Minutes of Exercise per Session: 20 min  Stress: No Stress Concern Present (10/09/2022)   [employer] of Occupational Health - Occupational Stress Questionnaire    Feeling of Stress : Not at all  Social Connections: Moderately Integrated (10/09/2022)   Social Connection and Isolation Panel    Frequency of Communication with Friends and Family: More than three times a week    Frequency of Social Gatherings with Friends and Family: More than three times a week    Attends Religious Services: More than 4 times per year    Active Member of [employer] or Organizations: No    Attends Banker Meetings: Never    Marital Status: Married  Catering Manager Violence: Not At Risk (10/09/2022)   Humiliation, Afraid, Rape, and Kick questionnaire    Fear of Current or Ex-Partner: No    Emotionally Abused: No    Physically Abused: No    Sexually Abused: No    Outpatient Medications Prior to Visit  Medication Sig Dispense Refill   amLODipine  (NORVASC ) 5 MG tablet Take 1 tablet (5 mg total) by mouth daily. 90 tablet 0   azelastine  (ASTELIN ) 0.1 % nasal spray Place 2 sprays into both nostrils 2 (two) times daily. Use in each nostril as directed (Patient taking differently: Place 2 sprays into both nostrils 2 (two) times daily as needed. Use in each nostril as directed) 30 mL 2   hydroxychloroquine  (PLAQUENIL ) 200 MG tablet TAKE 1 TABLET(200 MG) BY MOUTH TWICE DAILY 180 tablet 2   metoprolol  tartrate (LOPRESSOR ) 25 MG tablet Take 0.5 tablets (12.5 mg total) by mouth 2 (two) times daily.  (Patient taking differently: Take 12.5 mg by mouth as needed (palpitations).) 180 tablet 3   nitroGLYCERIN  (NITROSTAT ) 0.4 MG SL tablet Place 1 tablet (0.4 mg total) under the tongue every 5 (five) minutes as needed for chest pain. 50 tablet 0   omeprazole  (PRILOSEC) 40 MG capsule TAKE 1 CAPSULE(40 MG) BY MOUTH DAILY 90 capsule 3   atorvastatin  (LIPITOR) 40 MG tablet Take 1 tablet (40 mg total) by mouth daily. (Patient not taking: Reported on 09/06/2024) 90 tablet 0   phentermine  (ADIPEX-P ) 37.5 MG tablet Take 1 tablet (37.5 mg total) by mouth daily before breakfast. (Patient not taking: Reported on 05/31/2024) 30 tablet 0   albuterol  (VENTOLIN  HFA) 108 (90 Base) MCG/ACT inhaler Inhale 2 puffs into the lungs every 6 (six) hours as needed for wheezing or shortness of breath. (Patient not taking: Reported on 09/06/2024) 8 g 0   No facility-administered medications prior to visit.  No Known Allergies  Review of Systems  Constitutional:  Positive for appetite change and fatigue. Negative for chills and fever.  HENT:  Positive for ear pain, rhinorrhea, sinus pressure, sinus pain and sore throat.   Eyes: Negative.   Respiratory:  Positive for cough and chest tightness. Negative for shortness of breath and wheezing.   Gastrointestinal:  Positive for constipation, nausea and vomiting. Negative for diarrhea.  Endocrine: Negative.   Genitourinary:  Negative for dysuria, frequency and urgency.  Musculoskeletal: Negative.   Skin: Negative.   Allergic/Immunologic: Negative.   Neurological:  Positive for light-headedness. Negative for dizziness and headaches.  Hematological: Negative.   Psychiatric/Behavioral: Negative.         Objective:        09/06/2024    1:15 PM 05/31/2024    1:45 PM 02/02/2024    3:02 PM  Vitals with BMI  Height 5' 6.5 5' 6.6 5' 6.6  Weight 206 lbs 13 oz 204 lbs 6 oz 205 lbs  BMI 32.88 32.39 32.48  Systolic 162 134 863  Diastolic 86 76 84  Pulse 82 70 71    No  data found.   Physical Exam Constitutional:      General: She is not in acute distress.    Appearance: Normal appearance. She is obese. She is ill-appearing.  HENT:     Right Ear: There is impacted cerumen.     Left Ear: Ear canal normal. No tenderness. Tympanic membrane is erythematous.     Nose: Congestion and rhinorrhea present.     Mouth/Throat:     Pharynx: No posterior oropharyngeal erythema.  Eyes:     Conjunctiva/sclera: Conjunctivae normal.  Cardiovascular:     Rate and Rhythm: Normal rate and regular rhythm.     Heart sounds: Normal heart sounds. No murmur heard. Pulmonary:     Effort: Pulmonary effort is normal. No respiratory distress.     Breath sounds: Normal breath sounds. No wheezing.  Abdominal:     General: Bowel sounds are normal.     Palpations: Abdomen is soft.     Tenderness: There is no abdominal tenderness.  Musculoskeletal:        General: Normal range of motion.  Skin:    General: Skin is warm.  Neurological:     Mental Status: She is alert and oriented to person, place, and time. Mental status is at baseline.  Psychiatric:        Mood and Affect: Mood normal.        Behavior: Behavior normal.     Health Maintenance Due  Topic Date Due   HIV Screening  Never done   Hepatitis C Screening  Never done   DTaP/Tdap/Td (1 - Tdap) Never done   Pneumococcal Vaccine: 50+ Years (2 of 2 - PCV) 08/15/2020   Influenza Vaccine  06/09/2024   COVID-19 Vaccine (5 - 2025-26 season) 07/10/2024    There are no preventive care reminders to display for this patient.   Lab Results  Component Value Date   TSH 2.480 01/14/2024   Lab Results  Component Value Date   WBC 4.8 01/14/2024   HGB 13.0 01/14/2024   HCT 40.1 01/14/2024   MCV 95 01/14/2024   PLT 155 01/14/2024   Lab Results  Component Value Date   NA 142 01/14/2024   K 3.9 01/14/2024   CO2 25 01/14/2024   GLUCOSE 81 01/14/2024   BUN 17 01/14/2024   CREATININE 0.95 01/14/2024   BILITOT 0.5  01/14/2024  ALKPHOS 94 01/14/2024   AST 25 01/14/2024   ALT 28 01/14/2024   PROT 7.6 01/14/2024   ALBUMIN 4.8 01/14/2024   CALCIUM  9.8 01/14/2024   EGFR 67 01/14/2024   Lab Results  Component Value Date   CHOL 153 01/14/2024   Lab Results  Component Value Date   HDL 52 01/14/2024   Lab Results  Component Value Date   LDLCALC 87 01/14/2024   Lab Results  Component Value Date   TRIG 70 01/14/2024   Lab Results  Component Value Date   CHOLHDL 2.9 01/14/2024   Lab Results  Component Value Date   HGBA1C CANCELED 01/14/2024        Results for orders placed or performed in visit on 04/04/24  HM PAP SMEAR   Collection Time: 02/04/23 12:00 AM  Result Value Ref Range   HM Pap smear Negative   Results Console HPV   Collection Time: 02/04/23 12:00 AM  Result Value Ref Range   CHL HPV Negative      Assessment & Plan:   Assessment & Plan Upper respiratory tract infection, unspecified type Acute Increase fluid intake, rest and practice good hand hygeine Throw away your toothbrush and start using a new one Try lemon and honey and/or cough drops for cough  Orders:   albuterol  (VENTOLIN  HFA) 108 (90 Base) MCG/ACT inhaler; Inhale 2 puffs into the lungs every 6 (six) hours as needed for wheezing or shortness of breath.  Flu-like symptoms Flu and Covid- negative - symptoms last week were very similar to flu and covid - fever, sore throat and fatigue - She is feeling better but has a bad cough that keeps her up at night. - Continue to hydrate and get adequate sleep Orders:   POC Influenza A&B (Binax test)   POC COVID-19  Acute cough Acute cough Acute cough with congestion and chest tightness for ten days, severe enough to induce vomiting. Symptoms do not suggest pneumonia or other serious conditions. - Prescribe codeine-containing cough syrup to aid sleep. - Prescribe albuterol  inhaler for symptomatic relief. - Consider antibiotics if symptoms persist beyond ten  days, considering risks of GI issues and antibiotic resistance. Orders:   POC Influenza A&B (Binax test)   POC COVID-19   guaiFENesin-codeine 100-10 MG/5ML syrup; Take 5 mLs by mouth 3 (three) times daily as needed.  Essential hypertension, benign Elevated blood pressure due to decongestant use Blood pressure elevated at 162/86, likely secondary to decongestant use. Previous blood pressure was 134/76 in July. Discussed impact of decongestants on blood pressure and importance of monitoring. - Advise discontinuation of decongestants. - Monitor blood pressure and report if it decreases. - Contact healthcare provider if blood pressure normalizes.      Follow-up: Return if symptoms worsen or fail to improve.  An After Visit Summary was printed and given to the patient.  Harrie Cedar, FNP [facility] [phone]

## 2024-09-06 NOTE — Telephone Encounter (Signed)
 FYI Only or Action Required?: FYI only for provider. Patient scheduled for acute appointment  Patient was last seen in primary care on 01/14/2024 by Milon Cleaves, PA.  Called Nurse Triage reporting Cough and Nasal Congestion.  Symptoms began several days ago.  Interventions attempted: OTC medications: Robitussin DM, ginger crystals, Halls cough drops.  Symptoms are: cough started dry and has turned productive with white to yellow mucus and a streak of blood 2 days ago, nasal congestion, earaches gradually worsening. Body aches and joint pain  a few days ago when started feeling sick and has resolved.  Triage Disposition: See Physician Within 24 Hours  Patient/caregiver understands and will follow disposition?: Yes               Message from [facility] P sent at 09/06/2024  8:02 AM EDT  Reason for Triage: has a cough and sound very stuffed up, pt has called off of work. Cough has been going on since Friday, heavy cough   Reason for Disposition  Earache  Answer Assessment - Initial Assessment Questions 1. ONSET: When did the cough begin?      Thursday.  2. SEVERITY: How bad is the cough today?      She states during the day it is manageable but raging at night. She states her abdomen and back are sore from coughing.  3. SPUTUM: Describe the color of your sputum (e.g., none, dry cough; clear, white, yellow, green)     Started dry, now productive with white and yellow mucous.  4. HEMOPTYSIS: Are you coughing up any blood? If Yes, ask: How much? (e.g., flecks, streaks, tablespoons, etc.)     She states on Sunday or Monday there was some blood, a streak and only once.  5. DIFFICULTY BREATHING: Are you having difficulty breathing? If Yes, ask: How bad is it? (e.g., mild, moderate, severe)      No.  6. FEVER: Do you have a fever? If Yes, ask: What is your temperature, how was it measured, and when did it start?     No.  7. CARDIAC HISTORY: Do you have any  history of heart disease? (e.g., heart attack, congestive heart failure)      No.  8. LUNG HISTORY: Do you have any history of lung disease?  (e.g., pulmonary embolus, asthma, emphysema)     No.  9. PE RISK FACTORS: Do you have a history of blood clots? (or: recent major surgery, recent prolonged travel, bedridden)     No history of DVT; recent travel to [place].  10. OTHER SYMPTOMS: Do you have any other symptoms? (e.g., runny nose, wheezing, chest pain)       Denies wheezing, swelling, chest pain, headache. She states when she takes the Robitussin DM or ginger crystals she notices palpitations (states it lasts a few seconds and then resolves, comes and goes). Nasal congestion. She states when the symptoms first started she felt like she was hit by a truck (stiff joints, body aches, ear aches).  11. PREGNANCY: Is there any chance you are pregnant? When was your last menstrual period?       N/A.  12. TRAVEL: Have you traveled out of the country in the last month? (e.g., travel history, exposures)       Travelled to [place] over the weekend.  Protocols used: Cough - Acute Productive-A-AH

## 2024-09-06 NOTE — Assessment & Plan Note (Addendum)
 Acute Increase fluid intake, rest and practice good hand hygeine Throw away your toothbrush and start using a new one Try lemon and honey and/or cough drops for cough  Orders:   albuterol  (VENTOLIN  HFA) 108 (90 Base) MCG/ACT inhaler; Inhale 2 puffs into the lungs every 6 (six) hours as needed for wheezing or shortness of breath.

## 2024-11-12 NOTE — Progress Notes (Signed)
 "  Subjective:  Patient ID: Kathy Villa, female    DOB: December 05, 1960  Age: 64 y.o. MRN: [id number]  Chief Complaint  Patient presents with   Annual Exam    Well Adult Physical: Patient here for a comprehensive physical exam.The patient reports problems - twitching of left eye, Bl knee pain.  Do you take any herbs or supplements that were not prescribed by a doctor? yes - liver tonic, lymph tonic, elderberry, allercare, hissop tea. Are you taking calcium  supplements? no Are you taking aspirin daily? no  Encounter for general adult medical examination without abnormal findings  Physical (At Risk items are starred): Patient's last physical exam was 1 year ago .  Patient is not afflicted from Stress Incontinence and Urge Incontinence  Patient wears a seat belts Patient has smoke detectors and has carbon monoxide detectors. Patient practices appropriate gun safety. Patient wears sunscreen with extended sun exposure. Dental Care: biannual cleanings, brushes and flosses daily. Ophthalmology/Optometry: Annual visit.  Hearing loss: none Vision impairments: none  Menarche: 64 yo Menstrual History: regular menses until age 12 yo.  LMP: 64 yo Pregnancy history: G2P2003 Safe at home: yes Self breast exams: yes.    Discussed the use of AI scribe software for clinical note transcription with the patient, who gave verbal consent to proceed.  History of Present Illness Kathy Villa is a 64 year old female with lupus who presents for a physical exam and evaluation of left eye twitching and knee pain.  Left upper eyelid twitches all day - Twitching of the left upper eyelid for over a year - Occurs more frequently when fatigued - No associated pain - No twitching in other locations - Daily caffeine intake: one large cup of coffee in the morning  Knee arthralgia and stiffness - Left knee pain and stiffness began after a popping sensation while riding a bike - Persistent popping and  significant stiffness in the left knee - Aleve provides pain relief - Occasional right knee pain, less severe than left - Knee pain limits ability to exercise and makes walking difficult  Cutaneous and systemic manifestations of lupus - Current diagnosis of lupus, currently taking hydroxychloroquine  - Rashes on legs, attributed to heat and weight - Occasional shortness of breath and sore throats, not currently present  Peripheral neuropathy symptoms - Sharp, pin-like sensations in feet and hands, described as feeling like being poked - Recently started taking vitamin C - Questions possible vitamin B12 or folate deficiency  Gastrointestinal symptoms - Occasional nausea after eating, not consistent  Mood disturbance and weight gain - Anxiety and intermittent sadness, particularly related to weight gain - Weight gain attributed in part to holiday season - Attempts to eat healthily but struggles with nighttime cravings for sweets  Polyuria and nocturia - Frequent urination, especially at night - Associates increased urination with high salt intake - No swelling, dysuria, or urinary retention  Lower back pain - Occasional lower back pain, attributed to holding urine for extended periods  Supplement use - Taking multiple supplements: liver tonic, lymph tonic, elderberry, Aller-CAM for allergies      01/14/2024    8:23 AM 11/24/2023    1:30 PM 10/09/2022    8:08 AM 04/16/2022    9:03 AM 09/01/2020    9:28 PM  Depression screen PHQ 2/9  Decreased Interest 0 0 0 0 0  Down, Depressed, Hopeless 0 0 0 0 1  PHQ - 2 Score 0 0 0 0 1  Altered sleeping 1 0  0  1  Tired, decreased energy 1 0 0  1  Change in appetite 0 0 0  0  Feeling bad or failure about yourself  0 0 0  1  Trouble concentrating 0 0 0  1  Moving slowly or fidgety/restless 0 0 0  1  Suicidal thoughts 0 0 0  0  PHQ-9 Score 2  0  0   6   Difficult doing work/chores Not difficult at all Not difficult at all Not difficult at  all       Data saved with a previous flowsheet row definition         01/15/2021    8:09 AM 04/18/2021    7:59 AM 10/09/2022    8:08 AM 02/25/2023   12:04 PM 01/14/2024    8:23 AM  Fall Risk  Falls in the past year? 0 1 0 0 0  Was there an injury with Fall? 0  0  0  0  0   Fall Risk Category Calculator 0 1 0 0 0  Fall Risk Category (Retired) Low  Low  Low     (RETIRED) Patient Fall Risk Level Low fall risk  Low fall risk  Low fall risk     Patient at Risk for Falls Due to No Fall Risks No Fall Risks No Fall Risks No Fall Risks No Fall Risks  Fall risk Follow up Falls evaluation completed  Falls evaluation completed  Falls evaluation completed  Falls evaluation completed Falls evaluation completed     Data saved with a previous flowsheet row definition             Social Hx   Social History   Socioeconomic History   Marital status: Married    Spouse name: Not on file   Number of children: 3   Years of education: Not on file   Highest education level: Not on file  Occupational History   Occupation: Production Designer, Theatre/television/film- [employer]  Tobacco Use   Smoking status: Former    Current packs/day: 0.00    Types: Cigarettes    Quit date: 11/09/1982    Years since quitting: 42.0   Smokeless tobacco: Never  Vaping Use   Vaping status: Never Used  Substance and Sexual Activity   Alcohol use: Not Currently   Drug use: Never   Sexual activity: Yes  Other Topics Concern   Not on file  Social History Narrative   Right handed   Lives with husband   Social Drivers of Health   Tobacco Use: Medium Risk (11/19/2024)   Patient History    Smoking Tobacco Use: Former    Smokeless Tobacco Use: Never    Passive Exposure: Not on Actuary Strain: Low Risk (10/09/2022)   Overall Financial Resource Strain (CARDIA)    Difficulty of Paying Living Expenses: Not hard at all  Food Insecurity: Low Risk (10/24/2024)   Received from [facility]   Epic    Within  the past 12 months, you worried that your food would run out before you got money to buy more: Never true    Within the past 12 months, the food you bought just didn't last and you didn't have money to get more. : Never true  Transportation Needs: No Transportation Needs (10/24/2024)   Received from [facility]    In the past 12 months, has lack of reliable transportation kept you from medical appointments, meetings, work or  from getting things needed for daily living? : No  Physical Activity: Insufficiently Active (10/09/2022)   Exercise Vital Sign    Days of Exercise per Week: 3 days    Minutes of Exercise per Session: 20 min  Stress: No Stress Concern Present (10/09/2022)   [employer] of Occupational Health - Occupational Stress Questionnaire    Feeling of Stress : Not at all  Social Connections: Moderately Integrated (10/09/2022)   Social Connection and Isolation Panel    Frequency of Communication with Friends and Family: More than three times a week    Frequency of Social Gatherings with Friends and Family: More than three times a week    Attends Religious Services: More than 4 times per year    Active Member of Clubs or Organizations: No    Attends Banker Meetings: Never    Marital Status: Married  Depression (PHQ2-9): Low Risk (01/14/2024)   Depression (PHQ2-9)    PHQ-2 Score: 2  Alcohol Screen: Low Risk (10/09/2022)   Alcohol Screen    Last Alcohol Screening Score (AUDIT): 0  Housing: Low Risk (10/24/2024)   Received from [facility]   Epic    What is your living situation today?: I have a steady place to live    Think about the place you live. Do you have problems with any of the following? Choose all that apply:: None/None on this list  Utilities: Low Risk (10/24/2024)   Received from [facility]   Utilities    In the past 12 months has the electric, gas, oil, or water company threatened to shut off services in your home? : No   Health Literacy: Not on file   Past Medical History:  Diagnosis Date   Acquired deformity of toenail 11/24/2023   Acute cough 11/07/2020   Acute cystitis without hematuria 05/29/2023   Attention deficit hyperactivity disorder, predominantly inattentive type    prefers no medicines.   Cardiac murmur 02/07/2021   Chest tightness 01/17/2024   Chronic pain of right knee 10/20/2023   Class 1 obesity due to excess calories with serious comorbidity and body mass index (BMI) of 31.0 to 31.9 in adult 02/13/2023   DOE (dyspnea on exertion) 02/07/2021   Dysuria 05/29/2023   Encounter for immunization 08/28/2020   Essential (primary) hypertension 2016   Essential hypertension, benign 08/28/2020   GERD (gastroesophageal reflux disease) 09/02/2022   Ingrown toenail of both feet 10/20/2023   Insomnia 01/14/2020   Lumbar pain 11/04/2022   Migraine with aura, intractable, without status migrainosus 1980   Mixed hyperlipidemia 08/28/2020   Need for influenza vaccination 09/03/2022   Obesity (BMI 30.0-34.9) 02/07/2021   Onychomycosis 11/23/2021   Other specified nontoxic goiter    Overweight    Palpitations 02/07/2021   Paresthesias 01/14/2020   Recurrent UTI 09/03/2022   Sciatica of left side associated with disorder of lumbar spine 09/03/2022   Seasonal allergies 03/12/2023   Skin tag 02/13/2023   SLE (systemic lupus erythematosus related syndrome) (HCC) 01/14/2020   Systemic lupus erythematosus, organ or system involvement unspecified (HCC) 2006   Visit for screening mammogram 09/02/2022   Past Surgical History:  Procedure Laterality Date   CESAREAN SECTION      Family History  Problem Relation Age of Onset   Congestive Heart Failure Father     Review of Systems  Constitutional:  Positive for unexpected weight change. Negative for chills, fatigue and fever.  HENT:  Negative for congestion, ear pain and sore throat.  Respiratory:  Negative for cough and shortness of breath.    Cardiovascular:  Negative for chest pain.  Gastrointestinal:  Positive for nausea (sometimes after eating.). Negative for abdominal pain, constipation, diarrhea and vomiting.  Genitourinary:  Negative for dysuria and urgency.  Musculoskeletal:  Positive for arthralgias (left > right). Negative for myalgias.  Skin:  Negative for rash.  Neurological:  Negative for dizziness and headaches.  Psychiatric/Behavioral:  Negative for dysphoric mood. The patient is not nervous/anxious.      Objective:  BP 128/82   Pulse 88   Temp 97.9 F (36.6 C)   Ht 5' 6.5 (1.689 m)   Wt 205 lb (93 kg)   SpO2 97%   BMI 32.59 kg/m      11/13/2024    2:09 PM 09/06/2024    1:15 PM 05/31/2024    1:45 PM  BP/Weight  Systolic BP 128 162 134  Diastolic BP 82 86 76  Wt. (Lbs) 205 206.8 204.4  BMI 32.59 kg/m2 32.88 kg/m2 32.4 kg/m2    Physical Exam Vitals reviewed.  Constitutional:      General: She is not in acute distress.    Appearance: Normal appearance. She is normal weight.  HENT:     Right Ear: Tympanic membrane and ear canal normal.     Left Ear: Tympanic membrane and ear canal normal.     Nose: Nose normal. No congestion or rhinorrhea.  Eyes:     Conjunctiva/sclera: Conjunctivae normal.     Comments: Left eye spasm.  Neck:     Thyroid: No thyroid mass.     Vascular: No carotid bruit.  Cardiovascular:     Rate and Rhythm: Normal rate and regular rhythm.     Pulses: Normal pulses.     Heart sounds: Normal heart sounds. No murmur heard. Pulmonary:     Effort: Pulmonary effort is normal.     Breath sounds: Normal breath sounds.  Abdominal:     General: Bowel sounds are normal.     Palpations: Abdomen is soft. There is no mass.     Tenderness: There is no abdominal tenderness.  Musculoskeletal:     Comments: RIGHT KNEE EXAM Tender: negative Patellar apprehension: negative. Latera ligament laxity: negative McMurray's signs: negative Anterior drawer movement/Lachmans:  negative Posterior drawer movement: negative  LEFT KNEE EXAM Tender: negative Patellar apprehension: negative. Latera ligament laxity: negative McMurray's signs: positive Anterior drawer movement/Lachmans: negative Posterior drawer movement: negative   Lymphadenopathy:     Cervical: No cervical adenopathy.  Skin:    General: Skin is warm and dry.  Neurological:     Mental Status: She is alert and oriented to person, place, and time.     Cranial Nerves: No cranial nerve deficit.  Psychiatric:        Mood and Affect: Mood normal.        Behavior: Behavior normal.     Lab Results  Component Value Date   WBC 4.8 01/14/2024   HGB 13.0 01/14/2024   HCT 40.1 01/14/2024   PLT 155 01/14/2024   GLUCOSE 81 01/14/2024   CHOL 153 01/14/2024   TRIG 70 01/14/2024   HDL 52 01/14/2024   LDLCALC 87 01/14/2024   ALT 28 01/14/2024   AST 25 01/14/2024   NA 142 01/14/2024   K 3.9 01/14/2024   CL 102 01/14/2024   CREATININE 0.95 01/14/2024   BUN 17 01/14/2024   CO2 25 01/14/2024   TSH 2.480 01/14/2024   HGBA1C CANCELED 01/14/2024  Assessment & Plan:   Assessment & Plan Routine medical exam Up to date with mammogram and Pap smear. Discussed importance of regular exercise and healthy eating habits. - Encouraged regular exercise and healthy eating habits. - Discussed the importance of calcium  supplementation. Orders:   CBC with Differential/Platelet   Comprehensive metabolic panel with GFR   TSH   Lipid panel  Encounter for immunization  Orders:   Flu vaccine, recombinant, trivalent, inj  Twitching of eye Chronic twitching of the left upper eyelid, possibly related to fatigue or anxiety. Differential includes vitamin deficiencies, thyroid issues, or neurological causes. Reports occasional pins and needles sensation in hands and feet, possibly related to vitamin deficiencies. - Ordered blood tests for B12, folate, magnesium, and phosphorus levels. - Ordered MRI of the  brain to evaluate for neurological causes of eyelid twitching. - Will consider referral to a neurologist if no cause is identified from initial workup.  Orders:   Phosphorus   Magnesium   MR Brain W Wo Contrast; Future   Sedimentation Rate   C-reactive protein  Paresthesia Check labs.  Orders:   B12 and Folate Panel   Methylmalonic acid, serum   Sedimentation Rate   C-reactive protein  Chronic pain of both knees Chronic bilateral knee pain, more severe in the left knee, with popping and stiffness. Pain exacerbated by walking. Differential includes arthritis and possible cartilage issues. - Ordered x-rays for both knees to assess for arthritis and structural issues. - Will consider referral to physical therapy for knee pain management. Orders:   DG Knee Complete 4 Views Left; Future   DG Knee Complete 4 Views Right; Future  Dysuria Check ua.  Orders:   POCT URINALYSIS DIP (CLINITEK)   Urine Culture  Class 1 obesity due to excess calories with serious comorbidity and body mass index (BMI) of 32.0 to 32.9 in adult Obesity with recent weight gain, potentially exacerbated by systemic lupus erythematosus. Discussed weight impact on lupus and importance of weight management. Advised on dietary modifications. - Prescribed wegovy  with gradual dose increase. - Sent prescription to [facility] for insurance approval and cash pay options. - Instructed to message via MyChart in three weeks to report on medication tolerance and effectiveness. Orders:   semaglutide -weight management (WEGOVY ) 0.25 MG/0.5ML SOAJ SQ injection; Inject 0.25 mg into the skin once a week.  SLE (systemic lupus erythematosus related syndrome) (HCC) The current medical regimen is effective;  continue present plan and medications. Check labs.  Management per specialist.        Body mass index is 32.59 kg/m.   This is a list of the screening recommended for you and due dates:  Health Maintenance  Topic  Date Due   HIV Screening  Never done   Hepatitis C Screening  Never done   COVID-19 Vaccine (5 - 2025-26 season) 11/29/2024*   DTaP/Tdap/Td vaccine (1 - Tdap) 11/13/2025*   Pneumococcal Vaccine for age over 19 (2 of 2 - PCV) 11/13/2025*   Breast Cancer Screening  02/09/2026   Pap with HPV screening  09/21/2029   Colon Cancer Screening  09/27/2030   Flu Shot  Completed   Zoster (Shingles) Vaccine  Completed   Hepatitis B Vaccine  Aged Out   HPV Vaccine  Aged Out   Meningitis B Vaccine  Aged Out  *Topic was postponed. The date shown is not the original due date.     Meds ordered this encounter  Medications   semaglutide -weight management (WEGOVY ) 0.25 MG/0.5ML SOAJ SQ injection  Sig: Inject 0.25 mg into the skin once a week.    Dispense:  2 mL    Refill:  0    Follow-up: Return in about 3 months (around 02/11/2025) for chronic follow up.  An After Visit Summary was printed and given to the patient.  Abigail Free, MD [facility] [phone]  "

## 2024-11-13 ENCOUNTER — Ambulatory Visit (INDEPENDENT_AMBULATORY_CARE_PROVIDER_SITE_OTHER): Admitting: Family Medicine

## 2024-11-13 VITALS — BP 128/82 | HR 88 | Temp 97.9°F | Ht 66.5 in | Wt 205.0 lb

## 2024-11-13 DIAGNOSIS — R253 Fasciculation: Secondary | ICD-10-CM

## 2024-11-13 DIAGNOSIS — Z0000 Encounter for general adult medical examination without abnormal findings: Secondary | ICD-10-CM | POA: Diagnosis not present

## 2024-11-13 DIAGNOSIS — M25561 Pain in right knee: Secondary | ICD-10-CM

## 2024-11-13 DIAGNOSIS — Z6832 Body mass index (BMI) 32.0-32.9, adult: Secondary | ICD-10-CM

## 2024-11-13 DIAGNOSIS — Z23 Encounter for immunization: Secondary | ICD-10-CM | POA: Diagnosis not present

## 2024-11-13 DIAGNOSIS — M329 Systemic lupus erythematosus, unspecified: Secondary | ICD-10-CM

## 2024-11-13 DIAGNOSIS — E66811 Obesity, class 1: Secondary | ICD-10-CM

## 2024-11-13 DIAGNOSIS — R202 Paresthesia of skin: Secondary | ICD-10-CM

## 2024-11-13 DIAGNOSIS — E6609 Other obesity due to excess calories: Secondary | ICD-10-CM

## 2024-11-13 DIAGNOSIS — R300 Dysuria: Secondary | ICD-10-CM

## 2024-11-13 DIAGNOSIS — G8929 Other chronic pain: Secondary | ICD-10-CM

## 2024-11-13 DIAGNOSIS — M25562 Pain in left knee: Secondary | ICD-10-CM

## 2024-11-13 LAB — POCT URINALYSIS DIP (CLINITEK)
Bilirubin, UA: NEGATIVE
Blood, UA: NEGATIVE
Glucose, UA: NEGATIVE mg/dL
Ketones, POC UA: NEGATIVE mg/dL
Nitrite, UA: NEGATIVE
POC PROTEIN,UA: NEGATIVE
Spec Grav, UA: 1.025
Urobilinogen, UA: 0.2 U/dL
pH, UA: 6

## 2024-11-13 MED ORDER — WEGOVY 0.25 MG/0.5ML ~~LOC~~ SOAJ: 0.2500 mg | SUBCUTANEOUS | 0 refills | Status: AC

## 2024-11-13 NOTE — Patient Instructions (Signed)
" °  VISIT SUMMARY: During your visit, we discussed your physical exam and several health concerns, including left eye twitching, knee pain, lupus management, and weight gain. We have outlined a plan to address each of these issues, including medications, tests, and lifestyle recommendations.  YOUR PLAN: OBESITY: You have experienced recent weight gain, which may be affecting your lupus. We discussed the importance of managing your weight. -We prescribed a weight loss medication with a gradual dose increase. -The prescription has been sent to [facility] pharmacy for insurance approval and cash pay options. -Please message us  via MyChart in three weeks to report on how you are tolerating the medication and its effectiveness.  CHRONIC BILATERAL KNEE PAIN: You have chronic knee pain, especially in your left knee, with popping and stiffness. This pain is affecting your ability to walk and exercise. -We ordered x-rays for both knees to check for arthritis and structural issues. -We may refer you to physical therapy for knee pain management based on the x-ray results.  LOW BACK PAIN: You have occasional lower back pain, which may be due to a lack of stretching and core strengthening exercises. -We may refer you to physical therapy for back pain management.  SYSTEMIC LUPUS ERYTHEMATOSUS: Your lupus is currently managed with hydroxychloroquine . You reported occasional rashes on your legs, possibly due to heat. -Continue taking hydroxychloroquine  for lupus management.  FASCICULATION AND PARESTHESIA: You have chronic twitching of your left upper eyelid and occasional pins and needles sensations in your hands and feet. These symptoms may be related to vitamin deficiencies, thyroid issues, or neurological causes. -We ordered blood tests for B12, folate, magnesium, and phosphorus levels. -We ordered an MRI of your brain to check for neurological causes of the eyelid twitching. -We may refer you to a neurologist if  we do not find a cause from the initial tests.  GENERAL HEALTH MAINTENANCE: You are up to date with your mammogram and Pap smear. We discussed the importance of regular exercise and healthy eating habits. -Continue regular exercise and maintain healthy eating habits. -Consider calcium  supplementation as discussed.                      Contains text generated by [employer]bridge.                                 Contains text generated by [employer]bridge.   "

## 2024-11-15 ENCOUNTER — Other Ambulatory Visit

## 2024-11-16 ENCOUNTER — Other Ambulatory Visit: Payer: Self-pay

## 2024-11-16 DIAGNOSIS — M329 Systemic lupus erythematosus, unspecified: Secondary | ICD-10-CM

## 2024-11-16 LAB — URINE CULTURE

## 2024-11-19 ENCOUNTER — Encounter: Payer: Self-pay | Admitting: Family Medicine

## 2024-11-19 ENCOUNTER — Ambulatory Visit: Payer: Self-pay | Admitting: Family Medicine

## 2024-11-19 NOTE — Assessment & Plan Note (Signed)
 Check labs.  Orders:   B12 and Folate Panel   Methylmalonic acid, serum   Sedimentation Rate   C-reactive protein

## 2024-11-19 NOTE — Assessment & Plan Note (Signed)
 Chronic twitching of the left upper eyelid, possibly related to fatigue or anxiety. Differential includes vitamin deficiencies, thyroid issues, or neurological causes. Reports occasional pins and needles sensation in hands and feet, possibly related to vitamin deficiencies. - Ordered blood tests for B12, folate, magnesium, and phosphorus levels. - Ordered MRI of the brain to evaluate for neurological causes of eyelid twitching. - Will consider referral to a neurologist if no cause is identified from initial workup.  Orders:   Phosphorus   Magnesium   MR Brain W Wo Contrast; Future   Sedimentation Rate   C-reactive protein

## 2024-11-19 NOTE — Assessment & Plan Note (Signed)
 Up to date with mammogram and Pap smear. Discussed importance of regular exercise and healthy eating habits. - Encouraged regular exercise and healthy eating habits. - Discussed the importance of calcium  supplementation. Orders:   CBC with Differential/Platelet   Comprehensive metabolic panel with GFR   TSH   Lipid panel

## 2024-11-19 NOTE — Assessment & Plan Note (Signed)
 The current medical regimen is effective;  continue present plan and medications. Check labs.  Management per specialist.

## 2024-11-19 NOTE — Assessment & Plan Note (Signed)
 Chronic bilateral knee pain, more severe in the left knee, with popping and stiffness. Pain exacerbated by walking. Differential includes arthritis and possible cartilage issues. - Ordered x-rays for both knees to assess for arthritis and structural issues. - Will consider referral to physical therapy for knee pain management. Orders:   DG Knee Complete 4 Views Left; Future   DG Knee Complete 4 Views Right; Future

## 2024-11-19 NOTE — Assessment & Plan Note (Signed)
 Check ua.  Orders:   POCT URINALYSIS DIP (CLINITEK)   Urine Culture

## 2024-11-24 ENCOUNTER — Other Ambulatory Visit: Payer: Self-pay | Admitting: Family Medicine

## 2024-11-26 LAB — LIPID PANEL
Chol/HDL Ratio: 3.7 ratio (ref 0.0–4.4)
Cholesterol, Total: 192 mg/dL (ref 100–199)
HDL: 52 mg/dL
LDL Chol Calc (NIH): 130 mg/dL — ABNORMAL HIGH (ref 0–99)
Triglycerides: 56 mg/dL (ref 0–149)
VLDL Cholesterol Cal: 10 mg/dL (ref 5–40)

## 2024-11-26 LAB — CBC WITH DIFFERENTIAL/PLATELET
Basophils Absolute: 0 x10E3/uL (ref 0.0–0.2)
Basos: 1 %
EOS (ABSOLUTE): 0.1 x10E3/uL (ref 0.0–0.4)
Eos: 2 %
Hematocrit: 43.4 % (ref 34.0–46.6)
Hemoglobin: 14.1 g/dL (ref 11.1–15.9)
Immature Grans (Abs): 0 x10E3/uL (ref 0.0–0.1)
Immature Granulocytes: 0 %
Lymphocytes Absolute: 2.3 x10E3/uL (ref 0.7–3.1)
Lymphs: 52 %
MCH: 31 pg (ref 26.6–33.0)
MCHC: 32.5 g/dL (ref 31.5–35.7)
MCV: 95 fL (ref 79–97)
Monocytes Absolute: 0.4 x10E3/uL (ref 0.1–0.9)
Monocytes: 9 %
Neutrophils Absolute: 1.5 x10E3/uL (ref 1.4–7.0)
Neutrophils: 36 %
Platelets: 244 x10E3/uL (ref 150–450)
RBC: 4.55 x10E6/uL (ref 3.77–5.28)
RDW: 12.2 % (ref 11.7–15.4)
WBC: 4.3 x10E3/uL (ref 3.4–10.8)

## 2024-11-26 LAB — C-REACTIVE PROTEIN: CRP: <1 mg/L (ref 0–10)

## 2024-11-26 LAB — COMPREHENSIVE METABOLIC PANEL WITH GFR
ALT: 23 IU/L (ref 0–32)
AST: 28 IU/L (ref 0–40)
Albumin: 4.9 g/dL (ref 3.9–4.9)
Alkaline Phosphatase: 89 IU/L (ref 49–135)
BUN/Creatinine Ratio: 18 (ref 12–28)
BUN: 17 mg/dL (ref 8–27)
Bilirubin Total: 0.5 mg/dL (ref 0.0–1.2)
CO2: 22 mmol/L (ref 20–29)
Calcium: 9.7 mg/dL (ref 8.7–10.3)
Chloride: 103 mmol/L (ref 96–106)
Creatinine, Ser: 0.96 mg/dL (ref 0.57–1.00)
Globulin, Total: 2.7 g/dL (ref 1.5–4.5)
Glucose: 73 mg/dL (ref 70–99)
Potassium: 4.2 mmol/L (ref 3.5–5.2)
Sodium: 141 mmol/L (ref 134–144)
Total Protein: 7.6 g/dL (ref 6.0–8.5)
eGFR: 66 mL/min/1.73

## 2024-11-26 LAB — TSH: TSH: 1.71 u[IU]/mL (ref 0.450–4.500)

## 2024-11-26 LAB — PHOSPHORUS: Phosphorus: 3.2 mg/dL (ref 3.0–4.3)

## 2024-11-26 LAB — B12 AND FOLATE PANEL: Vitamin B-12: 1250 pg/mL — ABNORMAL HIGH (ref 232–1245)

## 2024-11-26 LAB — METHYLMALONIC ACID, SERUM: Methylmalonic Acid: 124 nmol/L (ref 0–378)

## 2024-11-26 LAB — SEDIMENTATION RATE: Sed Rate: 23 mm/h (ref 0–40)

## 2024-11-26 LAB — MAGNESIUM: Magnesium: 2 mg/dL (ref 1.6–2.3)

## 2024-11-27 ENCOUNTER — Encounter: Payer: Self-pay | Admitting: Family Medicine

## 2024-11-27 ENCOUNTER — Ambulatory Visit: Payer: Self-pay | Admitting: Family Medicine

## 2024-12-05 ENCOUNTER — Encounter: Payer: Self-pay | Admitting: Family Medicine

## 2024-12-07 ENCOUNTER — Other Ambulatory Visit: Payer: Self-pay | Admitting: Family Medicine

## 2024-12-07 ENCOUNTER — Ambulatory Visit (INDEPENDENT_AMBULATORY_CARE_PROVIDER_SITE_OTHER)
Admission: RE | Admit: 2024-12-07 | Discharge: 2024-12-07 | Disposition: A | Discharge status: Home / Self Care | Source: Ambulatory Visit | Attending: Family Medicine | Admitting: Family Medicine

## 2024-12-07 ENCOUNTER — Ambulatory Visit (HOSPITAL_BASED_OUTPATIENT_CLINIC_OR_DEPARTMENT_OTHER)
Admission: RE | Admit: 2024-12-07 | Discharge: 2024-12-07 | Disposition: A | Discharge status: Home / Self Care | Source: Ambulatory Visit | Attending: Family Medicine | Admitting: Family Medicine

## 2024-12-07 DIAGNOSIS — M25562 Pain in left knee: Secondary | ICD-10-CM | POA: Diagnosis not present

## 2024-12-07 DIAGNOSIS — Z0000 Encounter for general adult medical examination without abnormal findings: Secondary | ICD-10-CM

## 2024-12-07 DIAGNOSIS — M25561 Pain in right knee: Secondary | ICD-10-CM

## 2024-12-07 DIAGNOSIS — Z23 Encounter for immunization: Secondary | ICD-10-CM

## 2024-12-07 DIAGNOSIS — G8929 Other chronic pain: Secondary | ICD-10-CM

## 2024-12-07 DIAGNOSIS — R202 Paresthesia of skin: Secondary | ICD-10-CM

## 2024-12-07 DIAGNOSIS — R300 Dysuria: Secondary | ICD-10-CM

## 2024-12-07 DIAGNOSIS — R253 Fasciculation: Secondary | ICD-10-CM

## 2024-12-07 DIAGNOSIS — M329 Systemic lupus erythematosus, unspecified: Secondary | ICD-10-CM

## 2024-12-07 DIAGNOSIS — E66811 Obesity, class 1: Secondary | ICD-10-CM

## 2024-12-10 ENCOUNTER — Ambulatory Visit: Payer: Self-pay | Admitting: Family Medicine

## 2024-12-11 ENCOUNTER — Other Ambulatory Visit: Payer: Self-pay

## 2024-12-11 DIAGNOSIS — M06061 Rheumatoid arthritis without rheumatoid factor, right knee: Secondary | ICD-10-CM

## 2025-02-21 ENCOUNTER — Ambulatory Visit: Admitting: Family Medicine
